# Patient Record
Sex: Male | Born: 1960
Health system: Southern US, Community
[De-identification: ages and names within clinical notes are randomized; demographics above are authoritative.]

## PROBLEM LIST (undated history)

## (undated) DIAGNOSIS — I10 Essential (primary) hypertension: Secondary | ICD-10-CM

## (undated) DIAGNOSIS — E785 Hyperlipidemia, unspecified: Secondary | ICD-10-CM

## (undated) HISTORY — PX: OTHER SURGICAL HISTORY: SHX169

## (undated) HISTORY — DX: Hyperlipidemia, unspecified: E78.5

## (undated) HISTORY — PX: HERNIA REPAIR: SHX51

---

## 2007-10-29 ENCOUNTER — Emergency Department (HOSPITAL_COMMUNITY): Admission: EM | Admit: 2007-10-29 | Discharge: 2007-10-29 | Payer: Self-pay | Admitting: Emergency Medicine

## 2008-01-07 ENCOUNTER — Ambulatory Visit: Payer: Self-pay | Admitting: Surgery

## 2008-01-14 ENCOUNTER — Ambulatory Visit: Payer: Self-pay | Admitting: Surgery

## 2009-11-26 IMAGING — CR DG FINGER THUMB 2+V*R*
1 series · 1 of 1 positions shown · non-contrast
Comparison: None

CLINICAL DATA: Laceration, shattered a glass

RIGHT THUMB 2+V

[view not recorded]
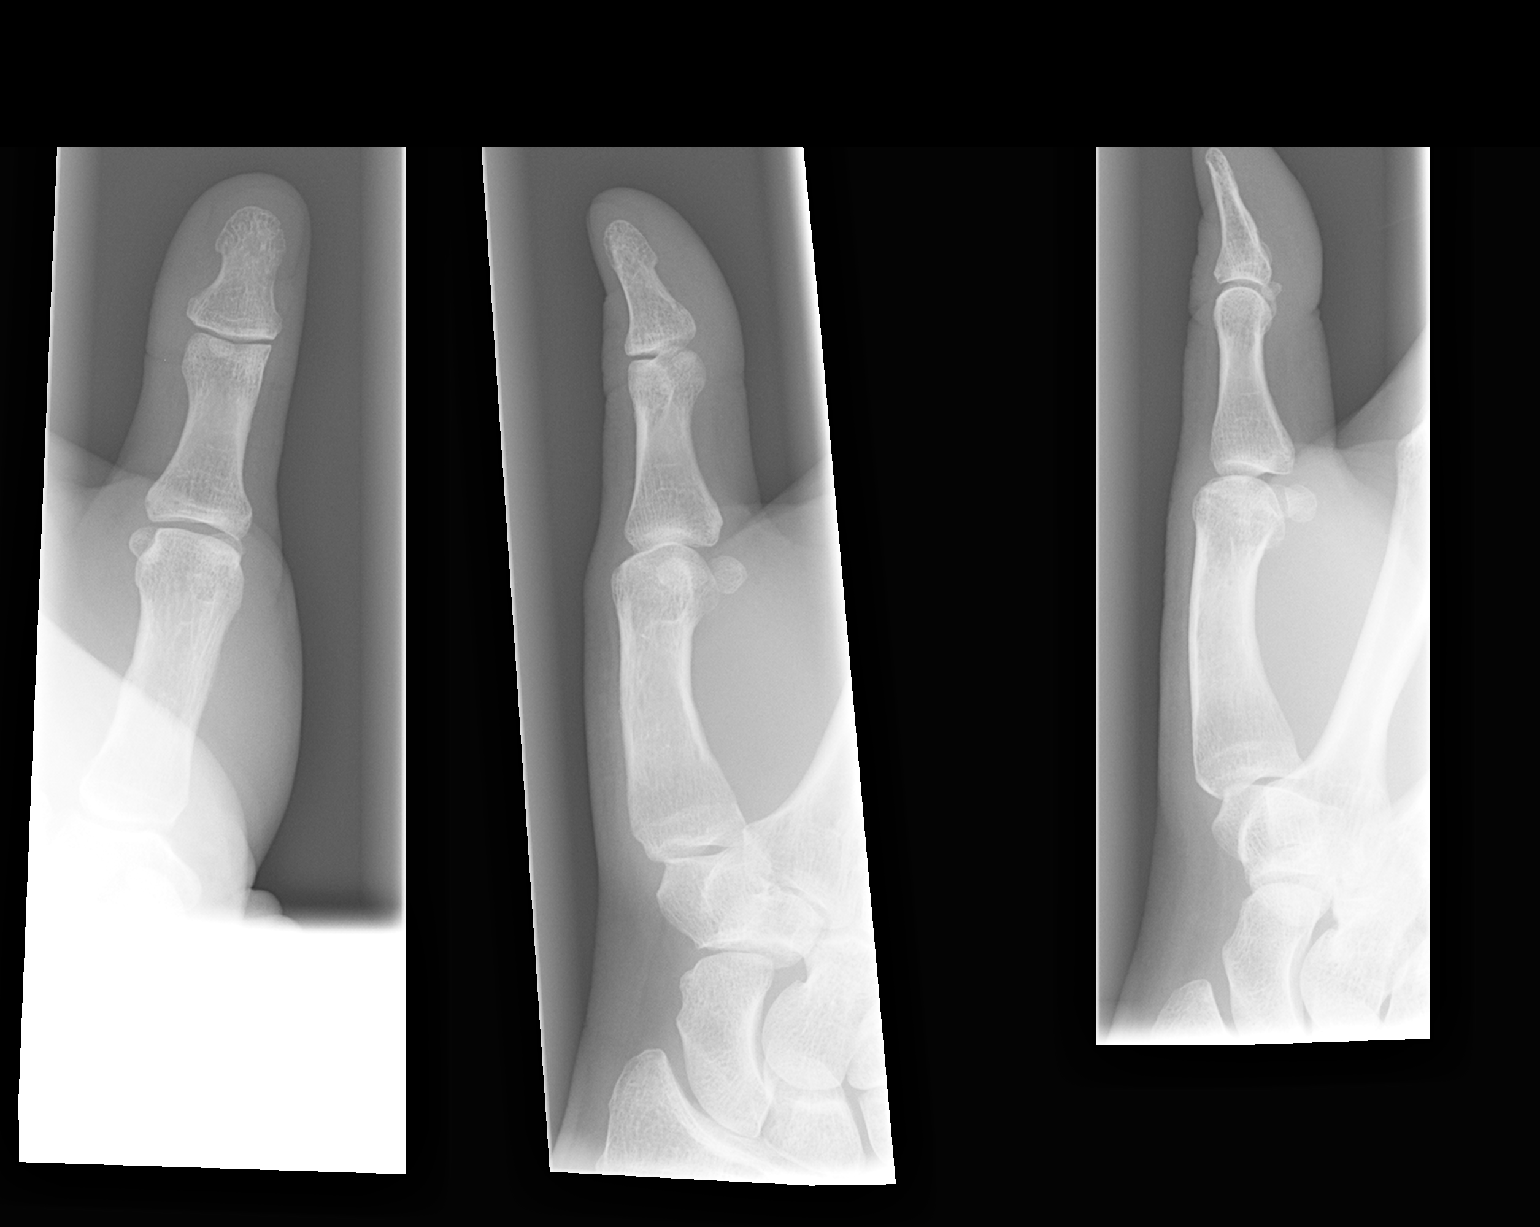

[1 of 1 positions shown; findings below may reference images not displayed]

FINDINGS: Bone mineralization normal.
Joint spaces preserved.
No fracture, dislocation, or bone destruction.
Tiny radiopacity seen in the ulnar soft tissues adjacent to head of
proximal phalanx left thumb, likely cassette artifact.
No definite radiopaque foreign body.
IMPRESSION: No acute abnormalities.

## 2013-01-12 ENCOUNTER — Emergency Department: Payer: Self-pay | Admitting: Emergency Medicine

## 2013-01-19 ENCOUNTER — Ambulatory Visit: Payer: Self-pay | Admitting: Family Medicine

## 2015-02-17 IMAGING — CR DG FOOT COMPLETE 3+V*L*
1 series · 3 of 3 positions shown · non-contrast
Comparison: None.

CLINICAL DATA: Foot pain

EXAM:
LEFT FOOT - COMPLETE 3+ VIEW

[Series 1: ap · 0.17mm/px · 3 of 3 slices shown]
[im 1/3]
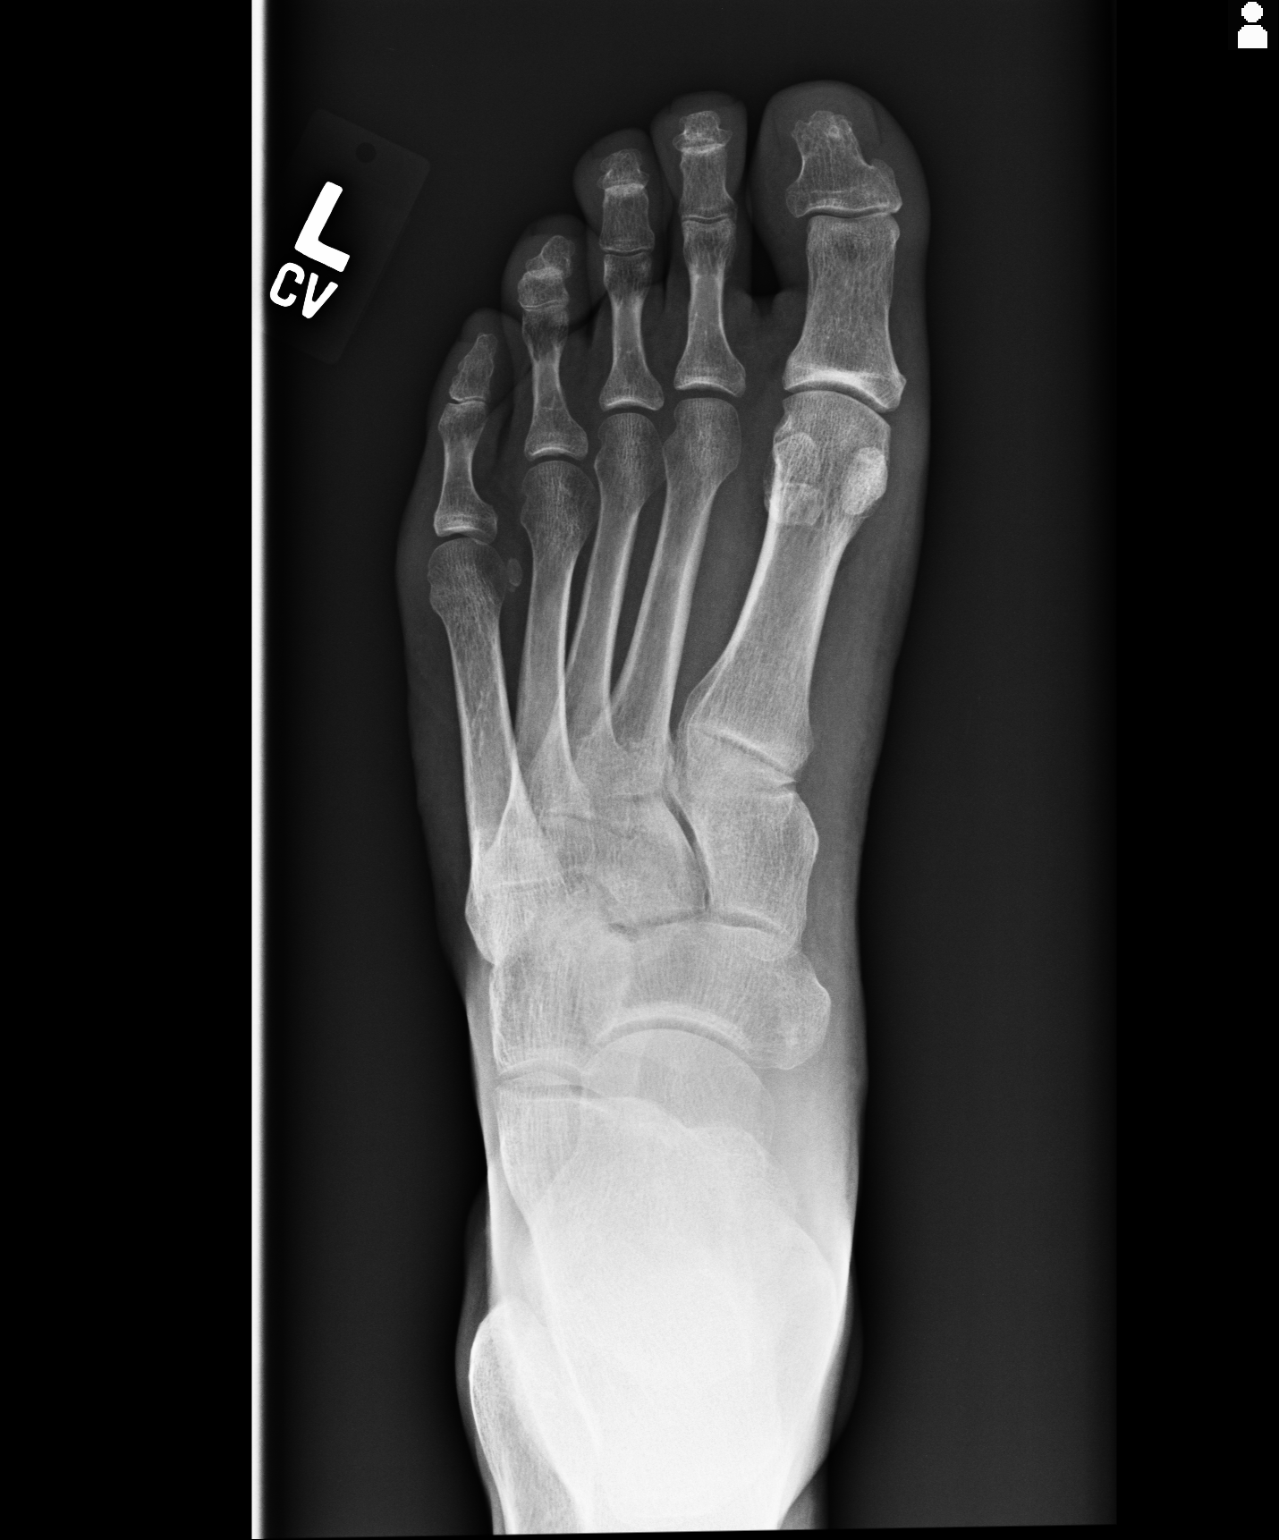
[im 2/3]
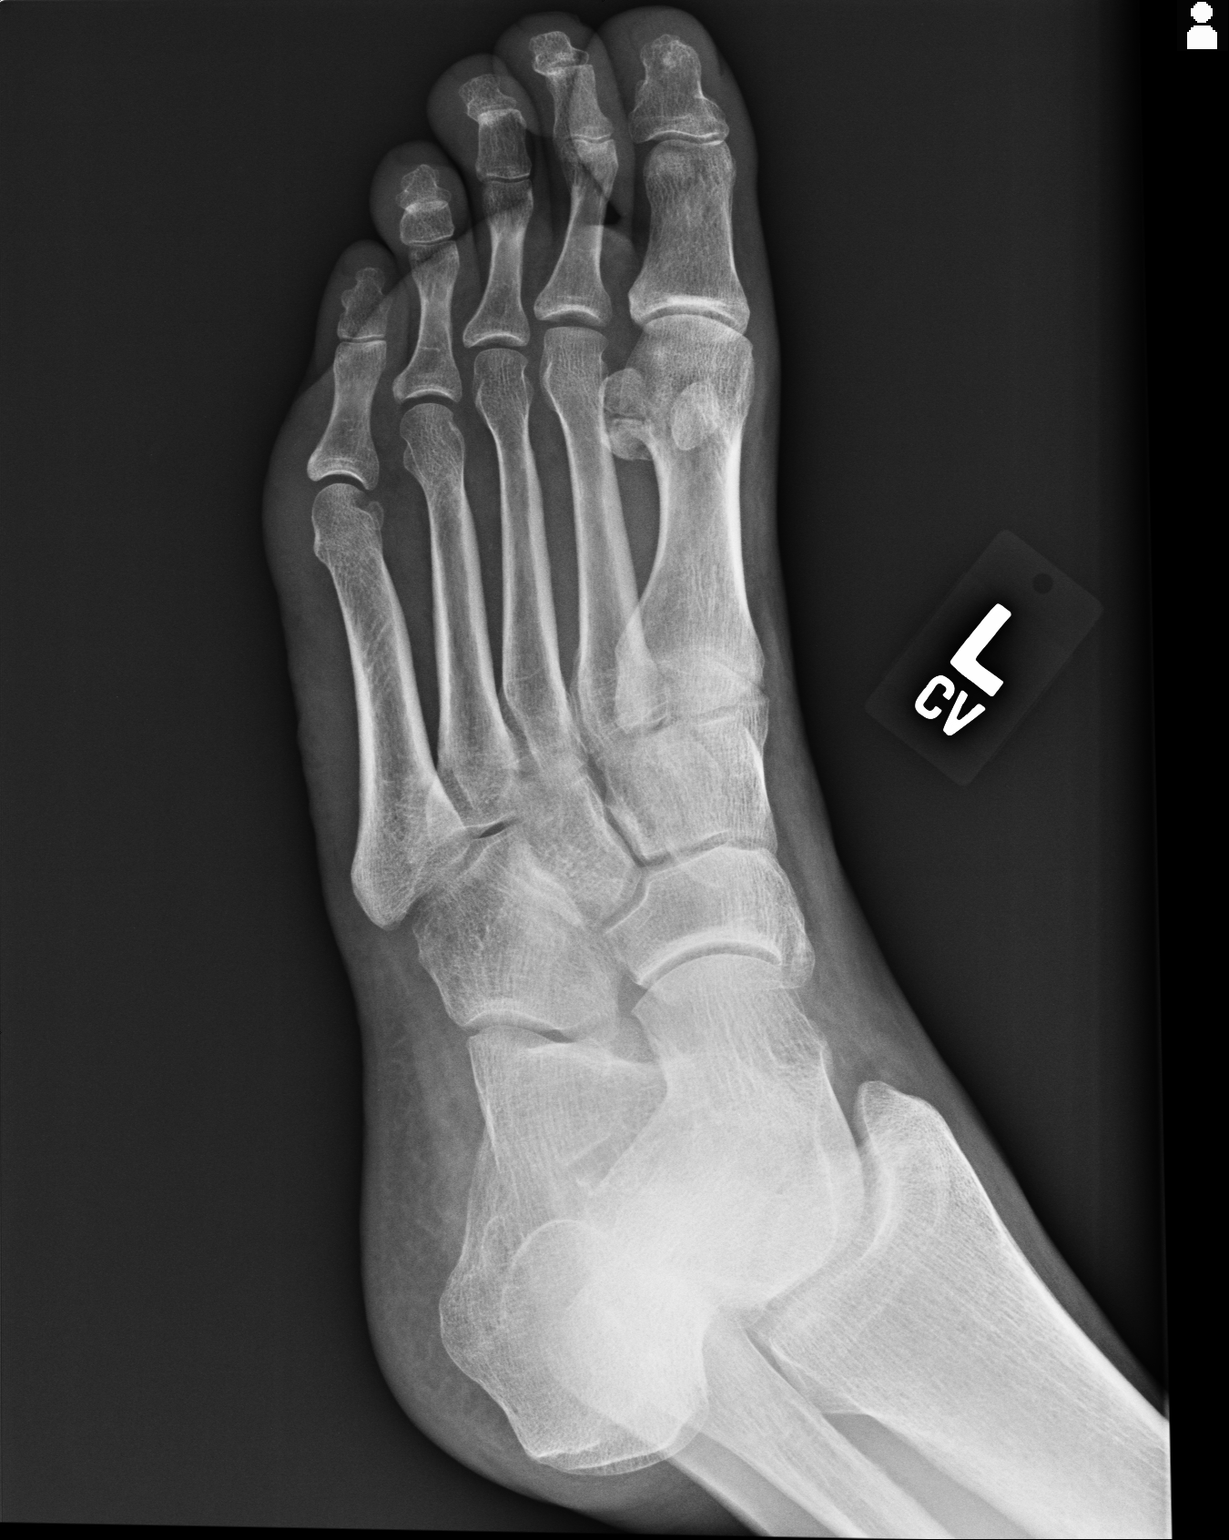
[im 3/3]
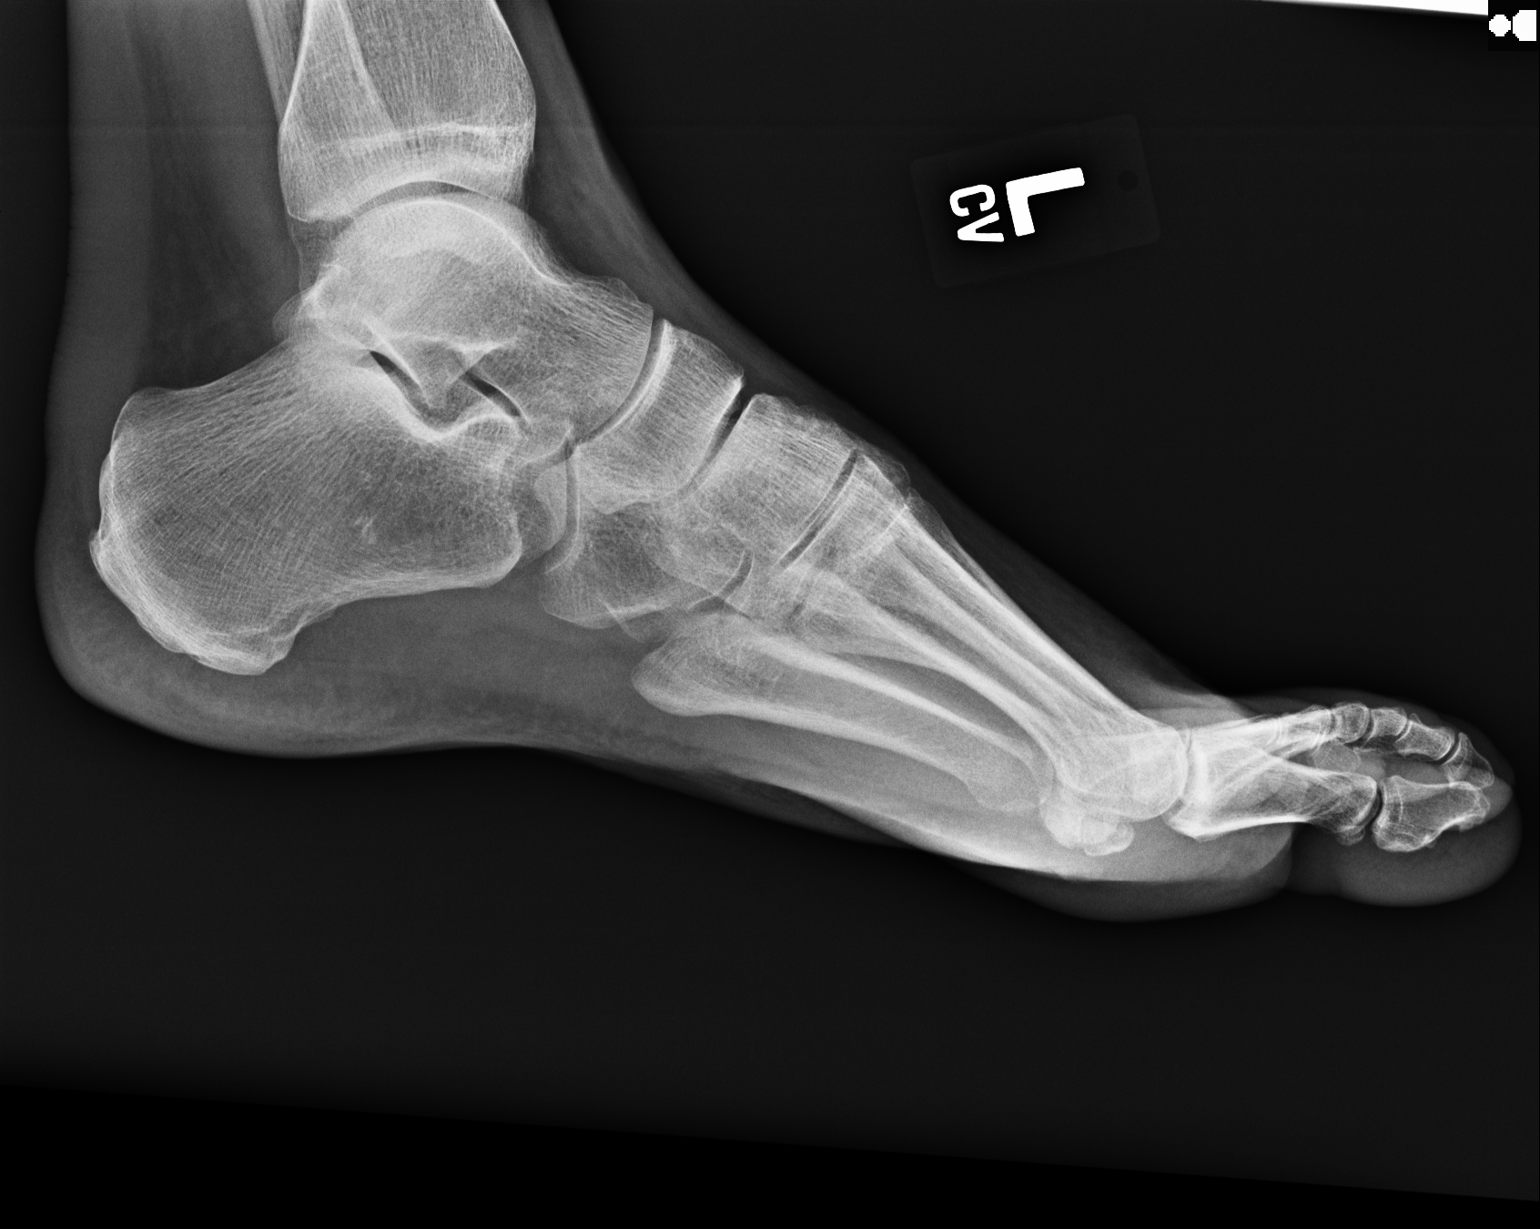

[3 of 3 positions shown; findings below may reference images not displayed]

FINDINGS: There is no evidence of fracture or dislocation. There is no
evidence of arthropathy or other focal bone abnormality. Soft
tissues are unremarkable.
IMPRESSION: Negative.

## 2015-08-16 DIAGNOSIS — H5213 Myopia, bilateral: Secondary | ICD-10-CM | POA: Diagnosis not present

## 2015-12-15 DIAGNOSIS — I10 Essential (primary) hypertension: Secondary | ICD-10-CM | POA: Diagnosis not present

## 2015-12-30 DIAGNOSIS — H6122 Impacted cerumen, left ear: Secondary | ICD-10-CM | POA: Diagnosis not present

## 2016-04-19 ENCOUNTER — Encounter: Payer: Self-pay | Admitting: Family Medicine

## 2016-04-19 ENCOUNTER — Ambulatory Visit (INDEPENDENT_AMBULATORY_CARE_PROVIDER_SITE_OTHER): Payer: 59 | Admitting: Family Medicine

## 2016-04-19 VITALS — BP 136/82 | Ht 73.5 in | Wt 203.8 lb

## 2016-04-19 DIAGNOSIS — I10 Essential (primary) hypertension: Secondary | ICD-10-CM

## 2016-04-19 MED ORDER — LISINOPRIL 10 MG PO TABS: 10.0000 mg | ORAL_TABLET | Freq: Every day | ORAL | 4 refills | Status: DC

## 2016-04-19 NOTE — Progress Notes (Signed)
   Subjective:    Patient ID: James Ochoa, male    DOB: [birth year], [age] y.o.   MRN: [id number]  Hypertension  This is a chronic problem. The current episode started more than 1 year ago. Risk factors for coronary artery disease include male gender. Treatments tried: zestoretic. There are no compliance problems.   This patient relates that overall he's been doing well. He denies any chest tightness pressure pain shortness of breath. Takes his blood pressure medicine uses it every other day because it makes him feel drowsy and low blood pressure. Denies sweats chills vomiting. Denies any strong family history of premature heart disease  Has been under a lot of stress because his son is been dealing with some substance abuse issues. Patient states he has been under a lot of stress and feels that is why his blood pressure is up   Review of Systems Patient denies any chest tightness pressure pain shortness of breath vomiting diarrhea etc.    Objective:   Physical Exam Neck no masses lungs are clear no crackles heart is regular pulse normal blood pressure is borderline elevated  Paced states his blood pressures at home are very good he states is more when he comes to a doctor's office       Assessment & Plan:  HTN we will reduce the medicine to lisinopril 10 mg daily stop the HCTZ portion patient will send us blood pressure readings in a couple weeks it is possible we may want to reduce the dosing to 5 mg if blood pressures are still low  He will fax to us these blood pressure readings plus also lipid profiles. He will follow-up in 6 months sooner if any problem

## 2016-04-19 NOTE — Patient Instructions (Signed)
Fax us your BP readings in a couple weeks  [phone]    DASH Eating Plan DASH stands for "Dietary Approaches to Stop Hypertension." The DASH eating plan is a healthy eating plan that has been shown to reduce high blood pressure (hypertension). Additional health benefits may include reducing the risk of type 2 diabetes mellitus, heart disease, and stroke. The DASH eating plan may also help with weight loss. What do I need to know about the DASH eating plan? For the DASH eating plan, you will follow these general guidelines:  Choose foods with less than 150 milligrams of sodium per serving (as listed on the food label).  Use salt-free seasonings or herbs instead of table salt or sea salt.  Check with your health care provider or pharmacist before using salt substitutes.  Eat lower-sodium products. These are often labeled as "low-sodium" or "no salt added."  Eat fresh foods. Avoid eating a lot of canned foods.  Eat more vegetables, fruits, and low-fat dairy products.  Choose whole grains. Look for the word "whole" as the first word in the ingredient list.  Choose fish and skinless chicken or [place] more often than red meat. Limit fish, poultry, and meat to 6 oz (170 g) each day.  Limit sweets, desserts, sugars, and sugary drinks.  Choose heart-healthy fats.  Eat more home-cooked food and less restaurant, buffet, and fast food.  Limit fried foods.  Do not fry foods. Cook foods using methods such as baking, boiling, grilling, and broiling instead.  When eating at a restaurant, ask that your food be prepared with less salt, or no salt if possible. What foods can I eat? Seek help from a dietitian for individual calorie needs. Grains  Whole grain or whole wheat bread. Brown rice. Whole grain or whole wheat pasta. Quinoa, bulgur, and whole grain cereals. Low-sodium cereals. Corn or whole wheat flour tortillas. Whole grain cornbread. Whole grain crackers. Low-sodium  crackers. Vegetables  Fresh or frozen vegetables (raw, steamed, roasted, or grilled). Low-sodium or reduced-sodium tomato and vegetable juices. Low-sodium or reduced-sodium tomato sauce and paste. Low-sodium or reduced-sodium canned vegetables. Fruits  All fresh, canned (in natural juice), or frozen fruits. Meat and Other Protein Products  Ground beef (85% or leaner), grass-fed beef, or beef trimmed of fat. Skinless chicken or [place]. Ground chicken or [place]. Pork trimmed of fat. All fish and seafood. Eggs. Dried beans, peas, or lentils. Unsalted nuts and seeds. Unsalted canned beans. Dairy  Low-fat dairy products, such as skim or 1% milk, 2% or reduced-fat cheeses, low-fat ricotta or cottage cheese, or plain low-fat yogurt. Low-sodium or reduced-sodium cheeses. Fats and Oils  Tub margarines without trans fats. Light or reduced-fat mayonnaise and salad dressings (reduced sodium). Avocado. Safflower, olive, or canola oils. Natural peanut or almond butter. Other  Unsalted popcorn and pretzels. The items listed above may not be a complete list of recommended foods or beverages. Contact your dietitian for more options.  What foods are not recommended? Grains  White bread. White pasta. White rice. Refined cornbread. Bagels and croissants. Crackers that contain trans fat. Vegetables  Creamed or fried vegetables. Vegetables in a cheese sauce. Regular canned vegetables. Regular canned tomato sauce and paste. Regular tomato and vegetable juices. Fruits  Canned fruit in light or heavy syrup. Fruit juice. Meat and Other Protein Products  Fatty cuts of meat. Ribs, chicken wings, bacon, sausage, bologna, salami, chitterlings, fatback, hot dogs, bratwurst, and packaged luncheon meats. Salted nuts and seeds. Canned beans with salt. Dairy  Whole or 2% milk, cream, half-and-half, and cream cheese. Whole-fat or sweetened yogurt. Full-fat cheeses or blue cheese. Nondairy creamers and whipped toppings.  Processed cheese, cheese spreads, or cheese curds. Condiments  Onion and garlic salt, seasoned salt, table salt, and sea salt. Canned and packaged gravies. Worcestershire sauce. Tartar sauce. Barbecue sauce. Teriyaki sauce. Soy sauce, including reduced sodium. Steak sauce. Fish sauce. Oyster sauce. Cocktail sauce. Horseradish. Ketchup and mustard. Meat flavorings and tenderizers. Bouillon cubes. Hot sauce. Tabasco sauce. Marinades. Taco seasonings. Relishes. Fats and Oils  Butter, stick margarine, lard, shortening, ghee, and bacon fat. Coconut, palm kernel, or palm oils. Regular salad dressings. Other  Pickles and olives. Salted popcorn and pretzels. The items listed above may not be a complete list of foods and beverages to avoid. Contact your dietitian for more information.  Where can I find more information? National Heart, Lung, and Blood Institute: travelstabloid.com This information is not intended to replace advice given to you by your health care provider. Make sure you discuss any questions you have with your health care provider. Document Released: 02/07/2011 Document Revised: 07/27/2015 Document Reviewed: 12/23/2012 Elsevier Interactive Patient Education  2017 [employer].

## 2016-10-17 ENCOUNTER — Encounter: Payer: Self-pay | Admitting: Family Medicine

## 2016-10-17 ENCOUNTER — Ambulatory Visit (INDEPENDENT_AMBULATORY_CARE_PROVIDER_SITE_OTHER): Payer: 59 | Admitting: Family Medicine

## 2016-10-17 VITALS — BP 128/78 | Ht 73.5 in | Wt 199.0 lb

## 2016-10-17 DIAGNOSIS — Z125 Encounter for screening for malignant neoplasm of prostate: Secondary | ICD-10-CM

## 2016-10-17 DIAGNOSIS — Z13220 Encounter for screening for lipoid disorders: Secondary | ICD-10-CM

## 2016-10-17 DIAGNOSIS — Z79899 Other long term (current) drug therapy: Secondary | ICD-10-CM

## 2016-10-17 DIAGNOSIS — I10 Essential (primary) hypertension: Secondary | ICD-10-CM

## 2016-10-17 MED ORDER — LISINOPRIL 10 MG PO TABS: 10.0000 mg | ORAL_TABLET | Freq: Every day | ORAL | 6 refills | Status: DC

## 2016-10-17 NOTE — Patient Instructions (Signed)
DASH Eating Plan DASH stands for "Dietary Approaches to Stop Hypertension." The DASH eating plan is a healthy eating plan that has been shown to reduce high blood pressure (hypertension). It may also reduce your risk for type 2 diabetes, heart disease, and stroke. The DASH eating plan may also help with weight loss. What are tips for following this plan? General guidelines  Avoid eating more than 2,300 mg (milligrams) of salt (sodium) a day. If you have hypertension, you may need to reduce your sodium intake to 1,500 mg a day.  Limit alcohol intake to no more than 1 drink a day for nonpregnant women and 2 drinks a day for men. One drink equals 12 oz of beer, 5 oz of wine, or 1 oz of hard liquor.  Work with your health care provider to maintain a healthy body weight or to lose weight. Ask what an ideal weight is for you.  Get at least 30 minutes of exercise that causes your heart to beat faster (aerobic exercise) most days of the week. Activities may include walking, swimming, or biking.  Work with your health care provider or diet and nutrition specialist (dietitian) to adjust your eating plan to your individual calorie needs. Reading food labels  Check food labels for the amount of sodium per serving. Choose foods with less than 5 percent of the Daily Value of sodium. Generally, foods with less than 300 mg of sodium per serving fit into this eating plan.  To find whole grains, look for the word "whole" as the first word in the ingredient list. Shopping  Buy products labeled as "low-sodium" or "no salt added."  Buy fresh foods. Avoid canned foods and premade or frozen meals. Cooking  Avoid adding salt when cooking. Use salt-free seasonings or herbs instead of table salt or sea salt. Check with your health care provider or pharmacist before using salt substitutes.  Do not fry foods. Cook foods using healthy methods such as baking, boiling, grilling, and broiling instead.  Cook with  heart-healthy oils, such as olive, canola, soybean, or sunflower oil. Meal planning   Eat a balanced diet that includes: ? 5 or more servings of fruits and vegetables each day. At each meal, try to fill half of your plate with fruits and vegetables. ? Up to 6-8 servings of whole grains each day. ? Less than 6 oz of lean meat, poultry, or fish each day. A 3-oz serving of meat is about the same size as a deck of cards. One egg equals 1 oz. ? 2 servings of low-fat dairy each day. ? A serving of nuts, seeds, or beans 5 times each week. ? Heart-healthy fats. Healthy fats called Omega-3 fatty acids are found in foods such as flaxseeds and coldwater fish, like sardines, salmon, and mackerel.  Limit how much you eat of the following: ? Canned or prepackaged foods. ? Food that is high in trans fat, such as fried foods. ? Food that is high in saturated fat, such as fatty meat. ? Sweets, desserts, sugary drinks, and other foods with added sugar. ? Full-fat dairy products.  Do not salt foods before eating.  Try to eat at least 2 vegetarian meals each week.  Eat more home-cooked food and less restaurant, buffet, and fast food.  When eating at a restaurant, ask that your food be prepared with less salt or no salt, if possible. What foods are recommended? The items listed may not be a complete list. Talk with your dietitian about what   dietary choices are best for you. Grains Whole-grain or whole-wheat bread. Whole-grain or whole-wheat pasta. Brown rice. Oatmeal. Quinoa. Bulgur. Whole-grain and low-sodium cereals. Pita bread. Low-fat, low-sodium crackers. Whole-wheat flour tortillas. Vegetables Fresh or frozen vegetables (raw, steamed, roasted, or grilled). Low-sodium or reduced-sodium tomato and vegetable juice. Low-sodium or reduced-sodium tomato sauce and tomato paste. Low-sodium or reduced-sodium canned vegetables. Fruits All fresh, dried, or frozen fruit. Canned fruit in natural juice (without  added sugar). Meat and other protein foods Skinless chicken or turkey. Ground chicken or turkey. Pork with fat trimmed off. Fish and seafood. Egg whites. Dried beans, peas, or lentils. Unsalted nuts, nut butters, and seeds. Unsalted canned beans. Lean cuts of beef with fat trimmed off. Low-sodium, lean deli meat. Dairy Low-fat (1%) or fat-free (skim) milk. Fat-free, low-fat, or reduced-fat cheeses. Nonfat, low-sodium ricotta or cottage cheese. Low-fat or nonfat yogurt. Low-fat, low-sodium cheese. Fats and oils Soft margarine without trans fats. Vegetable oil. Low-fat, reduced-fat, or light mayonnaise and salad dressings (reduced-sodium). Canola, safflower, olive, soybean, and sunflower oils. Avocado. Seasoning and other foods Herbs. Spices. Seasoning mixes without salt. Unsalted popcorn and pretzels. Fat-free sweets. What foods are not recommended? The items listed may not be a complete list. Talk with your dietitian about what dietary choices are best for you. Grains Baked goods made with fat, such as croissants, muffins, or some breads. Dry pasta or rice meal packs. Vegetables Creamed or fried vegetables. Vegetables in a cheese sauce. Regular canned vegetables (not low-sodium or reduced-sodium). Regular canned tomato sauce and paste (not low-sodium or reduced-sodium). Regular tomato and vegetable juice (not low-sodium or reduced-sodium). Pickles. Olives. Fruits Canned fruit in a light or heavy syrup. Fried fruit. Fruit in cream or butter sauce. Meat and other protein foods Fatty cuts of meat. Ribs. Fried meat. Bacon. Sausage. Bologna and other processed lunch meats. Salami. Fatback. Hotdogs. Bratwurst. Salted nuts and seeds. Canned beans with added salt. Canned or smoked fish. Whole eggs or egg yolks. Chicken or turkey with skin. Dairy Whole or 2% milk, cream, and half-and-half. Whole or full-fat cream cheese. Whole-fat or sweetened yogurt. Full-fat cheese. Nondairy creamers. Whipped toppings.  Processed cheese and cheese spreads. Fats and oils Butter. Stick margarine. Lard. Shortening. Ghee. Bacon fat. Tropical oils, such as coconut, palm kernel, or palm oil. Seasoning and other foods Salted popcorn and pretzels. Onion salt, garlic salt, seasoned salt, table salt, and sea salt. Worcestershire sauce. Tartar sauce. Barbecue sauce. Teriyaki sauce. Soy sauce, including reduced-sodium. Steak sauce. Canned and packaged gravies. Fish sauce. Oyster sauce. Cocktail sauce. Horseradish that you find on the shelf. Ketchup. Mustard. Meat flavorings and tenderizers. Bouillon cubes. Hot sauce and Tabasco sauce. Premade or packaged marinades. Premade or packaged taco seasonings. Relishes. Regular salad dressings. Where to find more information:  National Heart, Lung, and Blood Institute: www.nhlbi.nih.gov  American Heart Association: www.heart.org Summary  The DASH eating plan is a healthy eating plan that has been shown to reduce high blood pressure (hypertension). It may also reduce your risk for type 2 diabetes, heart disease, and stroke.  With the DASH eating plan, you should limit salt (sodium) intake to 2,300 mg a day. If you have hypertension, you may need to reduce your sodium intake to 1,500 mg a day.  When on the DASH eating plan, aim to eat more fresh fruits and vegetables, whole grains, lean proteins, low-fat dairy, and heart-healthy fats.  Work with your health care provider or diet and nutrition specialist (dietitian) to adjust your eating plan to your individual   calorie needs. This information is not intended to replace advice given to you by your health care provider. Make sure you discuss any questions you have with your health care provider. Document Released: 02/07/2011 Document Revised: 02/12/2016 Document Reviewed: 02/12/2016 Elsevier Interactive Patient Education  2017 Else[employer] Inc.  

## 2016-10-17 NOTE — Progress Notes (Signed)
   Subjective:    Patient ID: James Ochoa, male    DOB: [birth year], 58 y.o.   MRN: [id number]  HPIHTN. Eats healthy, exercises, takes lisinopril 10mg  every day. He does exercise he watches how he eats he does take his medicine regular basis denies any chest tightness pressure pain shortness breath states blood pressure readings have been good  Sense of smell has been off since spraying insect spray over one year ago.  Patient denies any headaches fever chills nosebleeds does relate some sinus issues   Review of Systems  Constitutional: Negative for activity change, fatigue and fever.  HENT: Negative for congestion, ear pain, rhinorrhea and tinnitus.   Eyes: Negative for discharge.  Respiratory: Negative for cough, shortness of breath and wheezing.   Cardiovascular: Negative for chest pain and leg swelling.  Neurological: Negative for headaches.       Objective:   Physical Exam  Constitutional: He appears well-developed and well-nourished. No distress.  HENT:  Head: Normocephalic.  Mouth/Throat: Oropharynx is clear and moist. No oropharyngeal exudate.  Neck: Normal range of motion.  Cardiovascular: Normal rate, regular rhythm and normal heart sounds.   No murmur heard. Pulmonary/Chest: Effort normal and breath sounds normal. No respiratory distress. He has no wheezes.  Musculoskeletal: He exhibits no edema.  Lymphadenopathy:    He has no cervical adenopathy.  Neurological: He is alert. He exhibits normal muscle tone.  Skin: Skin is warm and dry.  Psychiatric: His behavior is normal.  Nursing note and vitals reviewed.   I did advise patient get a colonoscopy he will think about it let us now      Assessment & Plan:  HTN-good control HTN- Patient was seen today as part of a visit regarding hypertension. The importance of healthy diet and regular physical activity was discussed. The importance of compliance with medications discussed. Ideal goal is to keep blood pressure low  elevated levels certainly below 140/90 when possible. The patient was counseled that keeping blood pressure under control lessen his risk of heart attack, stroke, kidney failure, and early death. The importance of regular follow-ups was discussed with the patient. Low-salt diet such as DASH recommended. Regular physical activity was recommended as well. Patient was advised to keep regular follow-ups.  Lab work ordered  Patient to consider colonoscopy  No obvious reason for lack of smell I doubt brain tumor if the patient starts having headaches harder time using his sinuses her breathing he will notify us and we will set him up with ENT

## 2017-04-10 ENCOUNTER — Other Ambulatory Visit (HOSPITAL_COMMUNITY)
Admission: AD | Admit: 2017-04-10 | Discharge: 2017-04-10 | Disposition: A | Discharge status: Home / Self Care | Payer: 59 | Source: Ambulatory Visit | Attending: Family Medicine | Admitting: Family Medicine

## 2017-04-10 DIAGNOSIS — E7849 Other hyperlipidemia: Secondary | ICD-10-CM | POA: Insufficient documentation

## 2017-04-10 DIAGNOSIS — Z79899 Other long term (current) drug therapy: Secondary | ICD-10-CM | POA: Diagnosis not present

## 2017-04-10 LAB — LIPID PANEL
Cholesterol: 245 mg/dL — ABNORMAL HIGH (ref 0–200)
HDL: 47 mg/dL (ref >40)
LDL Cholesterol: 190 mg/dL — ABNORMAL HIGH (ref 0–99)
TRIGLYCERIDES: 41 mg/dL (ref <150)
Total CHOL/HDL Ratio: 5.2 RATIO
VLDL: 8 mg/dL (ref 0–40)

## 2017-04-10 LAB — HEPATIC FUNCTION PANEL
ALT: 32 U/L (ref 17–63)
AST: 30 U/L (ref 15–41)
Albumin: 4.9 g/dL (ref 3.5–5.0)
Alkaline Phosphatase: 44 U/L (ref 38–126)
BILIRUBIN DIRECT: 0.2 mg/dL (ref 0.1–0.5)
BILIRUBIN TOTAL: 1.2 mg/dL (ref 0.3–1.2)
Indirect Bilirubin: 1 mg/dL — ABNORMAL HIGH (ref 0.3–0.9)
Total Protein: 8.5 g/dL — ABNORMAL HIGH (ref 6.5–8.1)

## 2017-04-10 LAB — BASIC METABOLIC PANEL
Anion gap: 12 (ref 5–15)
BUN: 13 mg/dL (ref 6–20)
CALCIUM: 9.5 mg/dL (ref 8.9–10.3)
CO2: 25 mmol/L (ref 22–32)
Chloride: 99 mmol/L — ABNORMAL LOW (ref 101–111)
Creatinine, Ser: 0.92 mg/dL (ref 0.61–1.24)
GFR calc Af Amer: >60 mL/min (ref >60)
GLUCOSE: 91 mg/dL (ref 65–99)
Potassium: 4.2 mmol/L (ref 3.5–5.1)
Sodium: 136 mmol/L (ref 135–145)

## 2017-04-11 LAB — PSA: Prostatic Specific Antigen: 0.42 ng/mL (ref 0.00–4.00)

## 2017-04-15 ENCOUNTER — Ambulatory Visit: Payer: 59 | Admitting: Family Medicine

## 2017-04-15 ENCOUNTER — Encounter: Payer: Self-pay | Admitting: Family Medicine

## 2017-04-15 VITALS — BP 140/98 | Ht 73.5 in | Wt 203.0 lb

## 2017-04-15 DIAGNOSIS — I10 Essential (primary) hypertension: Secondary | ICD-10-CM | POA: Diagnosis not present

## 2017-04-15 DIAGNOSIS — Z79899 Other long term (current) drug therapy: Secondary | ICD-10-CM | POA: Diagnosis not present

## 2017-04-15 DIAGNOSIS — E7849 Other hyperlipidemia: Secondary | ICD-10-CM | POA: Diagnosis not present

## 2017-04-15 MED ORDER — PRAVASTATIN SODIUM 20 MG PO TABS: 20.0000 mg | ORAL_TABLET | Freq: Every day | ORAL | 5 refills | Status: DC

## 2017-04-15 NOTE — Progress Notes (Signed)
Subjective:    Patient ID: James Ochoa, male    DOB: 03/25/1960, 57 y.o.   MRN: [id number]  HPI  Patient is here today to follow up on Htn.Takes Lisinopril 10 mg one Qd.He eats healthy and exercises.He does not see any specialists.  Patient for blood pressure check up. Patient relates compliance with meds. Todays BP reviewed with the patient. Patient denies issues with medication. Patient relates reasonable diet. Patient tries to minimize salt. Patient aware of BP goals.  Patient here for follow-up regarding cholesterol.  Patient does try to maintain a reasonable diet.  Patient does take the medication on a regular basis.  Denies missing a dose.  The patient denies any obvious side effects.  Prior blood work results reviewed with the patient.  The patient is aware of his cholesterol goals and the need to keep it under good control to lessen the risk of disease.   Review of Systems  Constitutional: Negative for activity change, appetite change and fatigue.  HENT: Negative for congestion and rhinorrhea.   Respiratory: Negative for cough, chest tightness and shortness of breath.   Cardiovascular: Negative for chest pain and leg swelling.  Gastrointestinal: Negative for abdominal pain, diarrhea and nausea.  Endocrine: Negative for polydipsia and polyphagia.  Genitourinary: Negative for dysuria and hematuria.  Neurological: Negative for weakness and headaches.  Psychiatric/Behavioral: Negative for confusion and dysphoric mood.       Objective:   Physical Exam  Constitutional: He appears well-nourished. No distress.  HENT:  Head: Normocephalic and atraumatic.  Eyes: Right eye exhibits no discharge. Left eye exhibits no discharge.  Neck: No tracheal deviation present.  Cardiovascular: Normal rate, regular rhythm and normal heart sounds.  No murmur heard. Pulmonary/Chest: Effort normal and breath sounds normal. No respiratory distress. He has no wheezes.  Musculoskeletal: He exhibits no  edema.  Lymphadenopathy:    He has no cervical adenopathy.  Neurological: He is alert.  Skin: Skin is warm. No rash noted.  Psychiatric: His behavior is normal.  Vitals reviewed.          Assessment & Plan:  HTN- Patient was seen today as part of a visit regarding hypertension. The importance of healthy diet and regular physical activity was discussed. The importance of compliance with medications discussed. Ideal goal is to keep blood pressure low elevated levels certainly below 140/90 when possible. The patient was counseled that keeping blood pressure under control lessen his risk of heart attack, stroke, kidney failure, and early death. The importance of regular follow-ups was discussed with the patient. Low-salt diet such as DASH recommended. Regular physical activity was recommended as well. Patient was advised to keep regular follow-ups.  The patient was seen today as part of an evaluation regarding hyperlipidemia. Recent lab work has been reviewed with the patient as well as the goals for good cholesterol care. In addition to this medications have been discussed the importance of compliance with diet and medications discussed as well. Patient has been informed of potential side effects of medications in the importance to notify us should any problems occur. Finally the patient is aware that poor control of cholesterol, noncompliance can dramatically increase her risk of heart attack strokes and premature death. The patient will keep regular office visits and the patient does agreed to periodic lab work. Patient agrees to start statins side effects discussed with the patient patient will do lipid liver in approximately 8 weeks in addition to this patient will do our standard follow-up visit in  6 months

## 2017-04-20 ENCOUNTER — Encounter: Payer: Self-pay | Admitting: Family Medicine

## 2017-04-22 ENCOUNTER — Other Ambulatory Visit: Payer: Self-pay | Admitting: *Deleted

## 2017-04-22 MED ORDER — LISINOPRIL 10 MG PO TABS: 10.0000 mg | ORAL_TABLET | Freq: Every day | ORAL | 1 refills | Status: DC

## 2017-10-09 ENCOUNTER — Other Ambulatory Visit (HOSPITAL_COMMUNITY)
Admission: RE | Admit: 2017-10-09 | Discharge: 2017-10-09 | Disposition: A | Discharge status: Home / Self Care | Payer: 59 | Source: Ambulatory Visit | Attending: Family Medicine | Admitting: Family Medicine

## 2017-10-09 DIAGNOSIS — Z79899 Other long term (current) drug therapy: Secondary | ICD-10-CM | POA: Diagnosis not present

## 2017-10-09 DIAGNOSIS — E7849 Other hyperlipidemia: Secondary | ICD-10-CM | POA: Diagnosis not present

## 2017-10-09 LAB — HEPATIC FUNCTION PANEL
ALK PHOS: 50 U/L (ref 38–126)
ALT: 38 U/L (ref 0–44)
AST: 33 U/L (ref 15–41)
Albumin: 4.8 g/dL (ref 3.5–5.0)
BILIRUBIN INDIRECT: 1 mg/dL — AB (ref 0.3–0.9)
Bilirubin, Direct: 0.2 mg/dL (ref 0.0–0.2)
Total Bilirubin: 1.2 mg/dL (ref 0.3–1.2)
Total Protein: 8.1 g/dL (ref 6.5–8.1)

## 2017-10-09 LAB — LIPID PANEL
CHOL/HDL RATIO: 5.8 ratio
Cholesterol: 225 mg/dL — ABNORMAL HIGH (ref 0–200)
HDL: 39 mg/dL — AB (ref >40)
LDL CALC: 168 mg/dL — AB (ref 0–99)
Triglycerides: 91 mg/dL (ref <150)
VLDL: 18 mg/dL (ref 0–40)

## 2017-10-13 ENCOUNTER — Ambulatory Visit: Payer: 59 | Admitting: Family Medicine

## 2017-10-13 ENCOUNTER — Encounter: Payer: Self-pay | Admitting: Family Medicine

## 2017-10-13 VITALS — BP 136/82 | Ht 73.5 in | Wt 206.2 lb

## 2017-10-13 DIAGNOSIS — E7849 Other hyperlipidemia: Secondary | ICD-10-CM | POA: Diagnosis not present

## 2017-10-13 DIAGNOSIS — I10 Essential (primary) hypertension: Secondary | ICD-10-CM | POA: Diagnosis not present

## 2017-10-13 MED ORDER — PRAVASTATIN SODIUM 20 MG PO TABS: 20.0000 mg | ORAL_TABLET | Freq: Every day | ORAL | 5 refills | Status: DC

## 2017-10-13 MED ORDER — LISINOPRIL 10 MG PO TABS: 10.0000 mg | ORAL_TABLET | Freq: Every day | ORAL | 1 refills | Status: DC

## 2017-10-13 MED ORDER — ZOSTER VAC RECOMB ADJUVANTED 50 MCG/0.5ML IM SUSR: 0.5000 mL | Freq: Once | INTRAMUSCULAR | 1 refills | Status: AC

## 2017-10-13 NOTE — Patient Instructions (Signed)

## 2017-10-13 NOTE — Progress Notes (Signed)
   Subjective:    Patient ID: James Ochoa, male    DOB: 10/13/1960, 57 y.o.   MRN: [id number]  Hypertension  This is a chronic problem. The current episode started more than 1 year ago. The problem has been gradually improving since onset. Pertinent negatives include no chest pain, headaches, malaise/fatigue, neck pain, shortness of breath or sweats. The current treatment provides significant improvement. There are no compliance problems.     Patient here for follow-up regarding cholesterol.  The patient does have hyperlipidemia.  Patient does try to maintain a reasonable diet.  Patient does take the medication on a regular basis.  Recently he has been taking the medicine every other day rather than every single day   denies missing a dose.  The patient denies any obvious side effects.  Prior blood work results reviewed with the patient.  The patient is aware of his cholesterol goals and the need to keep it under good control to lessen the risk of disease.    Review of Systems  Constitutional: Negative for diaphoresis, fatigue and malaise/fatigue.  HENT: Negative for congestion and rhinorrhea.   Respiratory: Negative for cough and shortness of breath.   Cardiovascular: Negative for chest pain and leg swelling.  Gastrointestinal: Negative for abdominal pain and diarrhea.  Musculoskeletal: Negative for neck pain.  Skin: Negative for color change and rash.  Neurological: Negative for dizziness and headaches.  Psychiatric/Behavioral: Negative for behavioral problems and confusion.       Objective:   Physical Exam  Constitutional: He appears well-nourished. No distress.  HENT:  Head: Normocephalic and atraumatic.  Eyes: Right eye exhibits no discharge. Left eye exhibits no discharge.  Neck: No tracheal deviation present.  Cardiovascular: Normal rate, regular rhythm and normal heart sounds.  No murmur heard. Pulmonary/Chest: Effort normal and breath sounds normal. No respiratory distress.   Musculoskeletal: He exhibits no edema.  Lymphadenopathy:    He has no cervical adenopathy.  Neurological: He is alert. Coordination normal.  Skin: Skin is warm and dry.  Psychiatric: He has a normal mood and affect. His behavior is normal.  Vitals reviewed.    Shingrix discussed     Assessment & Plan:  Hyperlipidemia subpar control making some improvements need to take the medicine every single day check lipid in 8 weeks more than likely will need to make adjustments in medication the goal is to get LDL below 100  Blood pressure good control continue current measures continue to do running as well as calisthenics and eating healthy  Wellness exam in 6 months

## 2018-04-10 ENCOUNTER — Other Ambulatory Visit: Payer: Self-pay | Admitting: Family Medicine

## 2018-04-14 ENCOUNTER — Other Ambulatory Visit (HOSPITAL_COMMUNITY)
Admission: AD | Admit: 2018-04-14 | Discharge: 2018-04-14 | Disposition: A | Discharge status: Home / Self Care | Payer: 59 | Source: Other Acute Inpatient Hospital | Attending: Family Medicine | Admitting: Family Medicine

## 2018-04-14 DIAGNOSIS — E782 Mixed hyperlipidemia: Secondary | ICD-10-CM | POA: Insufficient documentation

## 2018-04-14 DIAGNOSIS — E7849 Other hyperlipidemia: Secondary | ICD-10-CM | POA: Diagnosis present

## 2018-04-14 LAB — LIPID PANEL
Cholesterol: 193 mg/dL (ref 0–200)
HDL: 41 mg/dL (ref >40)
LDL CALC: 135 mg/dL — AB (ref 0–99)
Total CHOL/HDL Ratio: 4.7 RATIO
Triglycerides: 84 mg/dL (ref <150)
VLDL: 17 mg/dL (ref 0–40)

## 2018-04-15 ENCOUNTER — Encounter: Payer: Self-pay | Admitting: Family Medicine

## 2018-04-15 ENCOUNTER — Ambulatory Visit (INDEPENDENT_AMBULATORY_CARE_PROVIDER_SITE_OTHER): Payer: 59 | Admitting: Family Medicine

## 2018-04-15 VITALS — BP 132/86 | Ht 73.5 in | Wt 208.8 lb

## 2018-04-15 DIAGNOSIS — I10 Essential (primary) hypertension: Secondary | ICD-10-CM

## 2018-04-15 DIAGNOSIS — G4700 Insomnia, unspecified: Secondary | ICD-10-CM | POA: Diagnosis not present

## 2018-04-15 DIAGNOSIS — Z125 Encounter for screening for malignant neoplasm of prostate: Secondary | ICD-10-CM

## 2018-04-15 DIAGNOSIS — Z0000 Encounter for general adult medical examination without abnormal findings: Secondary | ICD-10-CM

## 2018-04-15 DIAGNOSIS — E782 Mixed hyperlipidemia: Secondary | ICD-10-CM

## 2018-04-15 DIAGNOSIS — E785 Hyperlipidemia, unspecified: Secondary | ICD-10-CM

## 2018-04-15 HISTORY — DX: Hyperlipidemia, unspecified: E78.5

## 2018-04-15 NOTE — Progress Notes (Signed)
Subjective:    Patient ID: James Ochoa, male    DOB: 03/05/1960, 57 y.o.   MRN: [id number]  HPI The patient comes in today for a wellness visit. Talk with the patient at length about the stress he is under His son who is of adult age unfortunately uses a lot of drugs in stays at home does not work consistently he has been very hard stressful issue Also the patient is concerned about his son's salvation in regards to Christianity   A review of their health history was completed.  A review of medications was also completed.  Any needed refills; yes Patient for blood pressure check up.  The patient does have hypertension.  The patient is on medication.  Patient relates compliance with meds. Todays BP reviewed with the patient. Patient denies issues with medication. Patient relates reasonable diet. Patient tries to minimize salt. Patient aware of BP goals.  Patient here for follow-up regarding cholesterol.  The patient does have hyperlipidemia.  Patient does try to maintain a reasonable diet.  Patient does take the medication on a regular basis.  Denies missing a dose.  The patient denies any obvious side effects.  Prior blood work results reviewed with the patient.  The patient is aware of his cholesterol goals and the need to keep it under good control to lessen the risk of disease.  Insomnia uses relaxation techniques to help patient does not want to do any type of counseling Eating habits: yes eating healthy  Falls/  MVA accidents in past few months: none  Regular exercise: yes pretty regularly  Specialist pt sees on regular basis: no  Preventative health issues were discussed.   Additional concerns: patient reports problems with stress and trouble sleeping    Review of Systems  Constitutional: Negative for activity change, appetite change and fever.  HENT: Negative for congestion and rhinorrhea.   Eyes: Negative for discharge.  Respiratory: Negative for cough and wheezing.     Cardiovascular: Negative for chest pain.  Gastrointestinal: Negative for abdominal pain, blood in stool and vomiting.  Genitourinary: Negative for difficulty urinating and frequency.  Musculoskeletal: Negative for neck pain.  Skin: Negative for rash.  Allergic/Immunologic: Negative for environmental allergies and food allergies.  Neurological: Negative for weakness and headaches.  Psychiatric/Behavioral: Negative for agitation.       Objective:   Physical Exam Constitutional:      Appearance: He is well-developed.  HENT:     Head: Normocephalic and atraumatic.     Right Ear: External ear normal.     Left Ear: External ear normal.     Nose: Nose normal.  Eyes:     Pupils: Pupils are equal, round, and reactive to light.  Neck:     Musculoskeletal: Normal range of motion and neck supple.     Thyroid: No thyromegaly.  Cardiovascular:     Rate and Rhythm: Normal rate and regular rhythm.     Heart sounds: Normal heart sounds. No murmur.  Pulmonary:     Effort: Pulmonary effort is normal. No respiratory distress.     Breath sounds: Normal breath sounds. No wheezing.  Abdominal:     General: Bowel sounds are normal. There is no distension.     Palpations: Abdomen is soft. There is no mass.     Tenderness: There is no abdominal tenderness.  Genitourinary:    Penis: Normal.   Musculoskeletal: Normal range of motion.  Lymphadenopathy:     Cervical: No cervical adenopathy.  Skin:    General: Skin is warm and dry.     Findings: No erythema.  Neurological:     Mental Status: He is alert.     Motor: No abnormal muscle tone.  Psychiatric:        Behavior: Behavior normal.        Judgment: Judgment normal.     Prostate exam normal      Assessment & Plan:  Patient to do more extensive blood work within the next 8 weeks Follow-up in 6 months for chronic health issues  Adult wellness-complete.wellness physical was conducted today. Importance of diet and exercise were  discussed in detail.  In addition to this a discussion regarding safety was also covered. We also reviewed over immunizations and gave recommendations regarding current immunization needed for age.  In addition to this additional areas were also touched on including: Preventative health exams needed:  Colonoscopy I have advised colonoscopy he states he will call us back with who his wife uses then he will move forward with this  HTN- Patient was seen today as part of a visit regarding hypertension. The importance of healthy diet and regular physical activity was discussed. The importance of compliance with medications discussed.  Ideal goal is to keep blood pressure low elevated levels certainly below 140/90 when possible.  The patient was counseled that keeping blood pressure under control lessen his risk of complications.  The importance of regular follow-ups was discussed with the patient.  Low-salt diet such as DASH recommended.  Regular physical activity was recommended as well.  Patient was advised to keep regular follow-ups.  The patient was seen today as part of an evaluation regarding hyperlipidemia.  Recent lab work has been reviewed with the patient as well as the goals for good cholesterol care.  In addition to this medications have been discussed the importance of compliance with diet and medications discussed as well.  Finally the patient is aware that poor control of cholesterol, noncompliance can dramatically increase the risk of complications. The patient will keep regular office visits and the patient does agreed to periodic lab work. He believes that his cholesterol profile does not look as good as it should because he relates he has not always been taking the medicine recently because he ran out he feels when he does follow-up blood work in several weeks it will look better  Under significant stress not depressed prefers not to be on medicine counseling measures offered  patient defers at this point-see above  Patient was advised yearly wellness exam

## 2018-04-21 ENCOUNTER — Emergency Department
Admission: EM | Admit: 2018-04-21 | Discharge: 2018-04-21 | Disposition: A | Discharge status: Home / Self Care | Payer: 59 | Attending: Emergency Medicine | Admitting: Emergency Medicine

## 2018-04-21 ENCOUNTER — Other Ambulatory Visit: Payer: Self-pay

## 2018-04-21 DIAGNOSIS — Z79899 Other long term (current) drug therapy: Secondary | ICD-10-CM | POA: Diagnosis not present

## 2018-04-21 DIAGNOSIS — R040 Epistaxis: Secondary | ICD-10-CM | POA: Diagnosis not present

## 2018-04-21 DIAGNOSIS — Z87891 Personal history of nicotine dependence: Secondary | ICD-10-CM | POA: Diagnosis not present

## 2018-04-21 DIAGNOSIS — I10 Essential (primary) hypertension: Secondary | ICD-10-CM | POA: Insufficient documentation

## 2018-04-21 HISTORY — DX: Essential (primary) hypertension: I10

## 2018-04-21 LAB — COMPREHENSIVE METABOLIC PANEL
ALK PHOS: 47 U/L (ref 38–126)
ALT: 38 U/L (ref 0–44)
ANION GAP: 8 (ref 5–15)
AST: 33 U/L (ref 15–41)
Albumin: 4.8 g/dL (ref 3.5–5.0)
BUN: 17 mg/dL (ref 6–20)
CALCIUM: 9.8 mg/dL (ref 8.9–10.3)
CO2: 25 mmol/L (ref 22–32)
Chloride: 103 mmol/L (ref 98–111)
Creatinine, Ser: 0.8 mg/dL (ref 0.61–1.24)
GFR calc non Af Amer: >60 mL/min (ref >60)
Glucose, Bld: 102 mg/dL — ABNORMAL HIGH (ref 70–99)
Potassium: 4.3 mmol/L (ref 3.5–5.1)
SODIUM: 136 mmol/L (ref 135–145)
TOTAL PROTEIN: 7.9 g/dL (ref 6.5–8.1)
Total Bilirubin: 0.7 mg/dL (ref 0.3–1.2)

## 2018-04-21 LAB — CBC WITH DIFFERENTIAL/PLATELET
Abs Immature Granulocytes: 0.02 10*3/uL (ref 0.00–0.07)
BASOS PCT: 1 %
Basophils Absolute: 0.1 10*3/uL (ref 0.0–0.1)
EOS ABS: 0.1 10*3/uL (ref 0.0–0.5)
Eosinophils Relative: 1 %
HCT: 45.5 % (ref 39.0–52.0)
Hemoglobin: 15.8 g/dL (ref 13.0–17.0)
IMMATURE GRANULOCYTES: 0 %
Lymphocytes Relative: 21 %
Lymphs Abs: 1.5 10*3/uL (ref 0.7–4.0)
MCH: 29.8 pg (ref 26.0–34.0)
MCHC: 34.7 g/dL (ref 30.0–36.0)
MCV: 85.7 fL (ref 80.0–100.0)
Monocytes Absolute: 0.6 10*3/uL (ref 0.1–1.0)
Monocytes Relative: 8 %
NEUTROS PCT: 69 %
Neutro Abs: 4.6 10*3/uL (ref 1.7–7.7)
PLATELETS: 234 10*3/uL (ref 150–400)
RBC: 5.31 MIL/uL (ref 4.22–5.81)
RDW: 12.3 % (ref 11.5–15.5)
WBC: 6.8 10*3/uL (ref 4.0–10.5)
nRBC: 0 % (ref 0.0–0.2)

## 2018-04-21 MED ORDER — LIDOCAINE-EPINEPHRINE 1 %-1:100000 IJ SOLN
10.0000 mL | Freq: Once | INTRAMUSCULAR | Status: DC
  Filled 2018-04-21: qty 1

## 2018-04-21 MED ORDER — TRANEXAMIC ACID 1000 MG/10ML IV SOLN
1000.0000 mg | Freq: Once | INTRAVENOUS | Status: DC
  Filled 2018-04-21: qty 10

## 2018-04-21 MED ORDER — OXYMETAZOLINE HCL 0.05 % NA SOLN
1.0000 | Freq: Once | NASAL | Status: AC
Start: 2018-04-21 — End: 2018-04-21
  Administered 2018-04-21: 1 via NASAL
  Filled 2018-04-21: qty 30

## 2018-04-21 NOTE — ED Notes (Signed)
Pharmacy contacted to obtain medication. Medication to be sent from pharmacy to flex tube station

## 2018-04-21 NOTE — ED Notes (Signed)
Explained to pt proper way to stop nosebleed. Pt was holding a towel up to bottom of nose. Informed he needs to pinch bridge of nose. Doesn't like wearing nasal clamp.

## 2018-04-21 NOTE — ED Provider Notes (Signed)
[facility] Emergency Department Provider Note  ____________________________________________  Time seen: Approximately 3:55 PM  I have reviewed the triage vital signs and the nursing notes.   HISTORY  Chief Complaint Epistaxis    HPI James Ochoa is a 58 y.o. male who presents the emergency department complaining of epistaxis for several hours.  Patient reports that he was experiencing a slow continuous nosebleed since this morning.  He states that he does have a history of nosebleeds and this typically occurs when either his blood pressure is up or is under stress.  Patient reports that he has a family stressor with his son.  His son has a drug problem, has been good until approximately a couple weeks and he is feeling significant amounts of stress due to ongoing issues with his son.  Patient reports that he was sneezing and coughing yesterday but did not have any epistaxis.  He has had epistaxis since this morning but he states that it is just a "drip or 2" at a time.  Patient denies any headache, visual changes, neck pain or stiffness, chest pain, shortness of breath, abdominal pain, nausea or vomiting.    Past Medical History:  Diagnosis Date  . Hyperlipidemia 04/15/2018  . Hypertension     Patient Active Problem List   Diagnosis Date Noted  . Hyperlipidemia 04/15/2018  . HTN (hypertension) 10/17/2016    Past Surgical History:  Procedure Laterality Date  . HERNIA REPAIR      Prior to Admission medications   Medication Sig Start Date End Date Taking? Authorizing Provider  lisinopril (PRINIVIL,ZESTRIL) 10 MG tablet ONCE A DAY 04/10/18   Babs Sciara, MD  pravastatin (PRAVACHOL) 20 MG tablet TAKE 1 TABLET BY MOUTH ONCE DAILY 04/10/18   Babs Sciara, MD    Allergies Patient has no known allergies.  History reviewed. No pertinent family history.  Social History Social History   Tobacco Use  . Smoking status: Former Games developer  . Smokeless tobacco:  Never Used  Substance Use Topics  . Alcohol use: Never    Frequency: Never  . Drug use: Not on file     Review of Systems  Constitutional: No fever/chills Eyes: No visual changes. No discharge ENT: Positive for epistaxis Cardiovascular: no chest pain. Respiratory: no cough. No SOB. Gastrointestinal: No abdominal pain.  No nausea, no vomiting.   Musculoskeletal: Negative for musculoskeletal pain. Skin: Negative for rash, abrasions, lacerations, ecchymosis. Neurological: Negative for headaches, focal weakness or numbness. 10-point ROS otherwise negative.  ____________________________________________   PHYSICAL EXAM:  VITAL SIGNS: ED Triage Vitals  Enc Vitals Group     BP 04/21/18 1533 (!) 154/106     Pulse Rate 04/21/18 1531 85     Resp --      Temp 04/21/18 1531 98.5 F (36.9 C)     Temp Source 04/21/18 1531 Oral     SpO2 04/21/18 1531 97 %     Weight 04/21/18 1533 202 lb (91.6 kg)     Height 04/21/18 1533 6\' 2"  (1.88 m)     Head Circumference --      Peak Flow --      Pain Score 04/21/18 1533 0     Pain Loc --      Pain Edu? --      Excl. in GC? --      Constitutional: Alert and oriented. Well appearing and in no acute distress. Eyes: Conjunctivae are normal. PERRL. EOMI. Head: Atraumatic. ENT:  Ears:       Nose: No congestion/rhinnorhea.  Visualization of bilateral nares reveals congealed blood.  Patient is holding a rag with an occasional drip of blood being expressed from the nares.  No significant epistaxis bleeding at this time.  Visualization reveals considerable control of blood but no obvious source of bleeding from Kiesselbach plexus's.      Mouth/Throat: Mucous membranes are moist.  Neck: No stridor.  Neck is supple full range of motion Hematological/Lymphatic/Immunilogical: No cervical lymphadenopathy. Cardiovascular: Normal rate, regular rhythm. Normal S1 and S2.  Good peripheral circulation. Respiratory: Normal respiratory effort without  tachypnea or retractions. Lungs CTAB. Good air entry to the bases with no decreased or absent breath sounds. Musculoskeletal: Full range of motion to all extremities. No gross deformities appreciated. Neurologic:  Normal speech and language. No gross focal neurologic deficits are appreciated.  Skin:  Skin is warm, dry and intact. No rash noted. Psychiatric: Mood and affect are normal. Speech and behavior are normal. Patient exhibits appropriate insight and judgement.   ____________________________________________   LABS (all labs ordered are listed, but only abnormal results are displayed)  Labs Reviewed  COMPREHENSIVE METABOLIC PANEL - Abnormal; Notable for the following components:      Result Value   Glucose, Bld 102 (*)    All other components within normal limits  CBC WITH DIFFERENTIAL/PLATELET   ____________________________________________  EKG   ____________________________________________  RADIOLOGY   No results found.  ____________________________________________    PROCEDURES  Procedure(s) performed:    Procedures    Medications  tranexamic acid (CYKLOKAPRON) injection 1,000 mg (has no administration in time range)  lidocaine-EPINEPHrine (XYLOCAINE W/EPI) 1 %-1:100000 (with pres) injection 10 mL (has no administration in time range)  oxymetazoline (AFRIN) 0.05 % nasal spray 1 spray (1 spray Each Nare Given 04/21/18 1553)     ____________________________________________   INITIAL IMPRESSION / ASSESSMENT AND PLAN / ED COURSE  Pertinent labs & imaging results that were available during my care of the patient were reviewed by me and considered in my medical decision making (see chart for details).  Review of the [place] CSRS was performed in accordance of the [facility] prior to dispensing any controlled drugs.  Clinical Course as of Apr 22 1911  Tue Apr 21, 2018  1557 Patient presents emergency department complaining of epistaxis ongoing since this morning.   Patient reports that it has been a slow continuous bleeding.  Patient reports that he does have history of epistaxis with stress or high blood pressure.  Patient has been under stress recently.  No defined area of bleeding from Kiesselbach plexus.  At this time patient has been given Afrin as well as nose clamp.  I will reevaluate after 15 minutes.  If bleeding persists at that time we will progress to more aggressive treatments.   [JC]  1650 Patient presented with epistaxis.  This was not significant but was continuous.  I have tried Afrin as well as nasal clamp with 15-minute interval with reevaluation.  Area continues to have a slow epistaxis.  At this time, basic labs are checked to ensure no significant anemia or thrombocytopenia.  CBC returns with reassuring results.  At this time, I will attempt cessation with soaking nasal pledgets with TXA and applying with direct pressure for 30 minutes.  We will reevaluate after this attempt.   [JC]  1803 Patient's nares are anesthetized using lidocaine with epi on nasal atomizer.  Rhino Rocket coated with TXA are applied to both nares.  Will  reassess in 30 minutes.   [JC]    Clinical Course User Index [JC] , Delorise Royals D, PA-C     Patient's diagnosis is consistent with acute, anterior epistaxis.  Patient presents emergency department with a nosebleed that have been ongoing for most the day.  While there was no significant bleeding there was a constant slow hemorrhage.  Initial attempts of using nasal clamp and Afrin were unsuccessful in reducing patient's symptoms.  Given ongoing symptoms, 2 Rhino Rocket's were coated using TXA.  These were inserted and left in for 30 minutes.  Patient had a complete cessation of bleeding at this time.  No return of bleeding after another 20 to 30 minutes of observation.  Patient is instructed to use Afrin for any mild recurrence of epistaxis.  If symptoms persist like they did today however patient should return to the  emergency department..  Follow-up with ENT as needed.. Patient is given ED precautions to return to the ED for any worsening or new symptoms.     ____________________________________________  FINAL CLINICAL IMPRESSION(S) / ED DIAGNOSES  Final diagnoses:  Acute anterior epistaxis      NEW MEDICATIONS STARTED DURING THIS VISIT:  ED Discharge Orders    None          This chart was dictated using voice recognition software/Dragon. Despite best efforts to proofread, errors can occur which can change the meaning. Any change was purely unintentional.    Racheal Patches,  D, PA-C 04/21/18 1913    Jeanmarie PlantMcShane, James A, MD 04/21/18 2132

## 2018-04-21 NOTE — ED Triage Notes (Signed)
Nosebleed that began today. Denies blood thinners. States a lot of sneezing yesterday. States gets nosebleeds with increase stress and states son is a drug user and has been causing him a lot of stress. Hx HTN.

## 2018-04-24 ENCOUNTER — Encounter: Payer: Self-pay | Admitting: Family Medicine

## 2018-04-24 ENCOUNTER — Ambulatory Visit: Payer: 59 | Admitting: Family Medicine

## 2018-04-24 VITALS — BP 142/92 | Wt 206.4 lb

## 2018-04-24 DIAGNOSIS — F419 Anxiety disorder, unspecified: Secondary | ICD-10-CM | POA: Insufficient documentation

## 2018-04-24 DIAGNOSIS — I10 Essential (primary) hypertension: Secondary | ICD-10-CM | POA: Diagnosis not present

## 2018-04-24 MED ORDER — LISINOPRIL 20 MG PO TABS: 20.0000 mg | ORAL_TABLET | Freq: Every day | ORAL | 2 refills | Status: DC

## 2018-04-24 NOTE — Patient Instructions (Addendum)
SSRI medication for anxiety like zoloft, lexapro, prozac

## 2018-04-24 NOTE — Progress Notes (Signed)
Subjective:    Patient ID: James Ochoa, male    DOB: [birth year], 58 y.o.   MRN: [id number]  Hypertension  This is a chronic problem. Pertinent negatives include no chest pain, headaches or shortness of breath. There are no compliance problems.    Pt states he went to [facility] ER on 04/21/2018 for nose bleed, states he tried to get it to stop for 3 hrs prior to going to ED; was in ED for 4 hours. BP at ER was 154/106 and pt states BP became worse.   Pt states he does take his medication as directed, rarely misses dose. Pt states he does have some increased stress due to son. Reports a lot of anxiety/stress d/t son using drugs; brother recently had a liver transplant doing well. Talks to pastor, no counseling. Typically exercises often - hasn't been able to recently d/t weather.   States last night BP 170-180/110    Review of Systems  Eyes: Negative for visual disturbance.  Respiratory: Negative for shortness of breath.   Cardiovascular: Negative for chest pain.  Neurological: Negative for dizziness, syncope and headaches.  Psychiatric/Behavioral: Negative for suicidal ideas. The patient is nervous/anxious.        Objective:   Physical Exam Vitals signs and nursing note reviewed.  Constitutional:      General: He is not in acute distress.    Appearance: He is well-developed.  HENT:     Head: Normocephalic and atraumatic.  Neck:     Musculoskeletal: Neck supple.  Cardiovascular:     Rate and Rhythm: Normal rate and regular rhythm.     Heart sounds: Normal heart sounds. No murmur.  Pulmonary:     Effort: Pulmonary effort is normal. No respiratory distress.     Breath sounds: Normal breath sounds.  Skin:    General: Skin is warm and dry.  Neurological:     Mental Status: He is alert and oriented to person, place, and time.  Psychiatric:        Mood and Affect: Mood normal.        Behavior: Behavior normal.           Assessment & Plan:  1. Essential  hypertension BP elevated in recent days likely d/t stress and anxiety per patient. BP elevated on repeat. Will increase lisinopril to 20 mg daily. DASH diet recommended. Encouraged to get back into his exercising as this helps relieve stress for him as well. Continue logging home BP and bring in at f/u appt. Warning signs discussed. Recommend f/u in 3 months with Dr. Lorin Picket.   2. Anxiety Pt was recently seen on 2/12 with Dr. Lorin Picket for wellness and at that time filled out GAD 7 and PHQ 9 - reviewed these today. Appears they discussed options of medication and counseling then as well and pt declined treatment then. Lengthy discussion with patient regarding anxiety symptoms and treatment options. Strongly recommended counseling and starting on a SSRI. Discussed with pt SSRIs are used to treat both depression and anxiety, we like to avoid use of benzodiazepines if possible. Pt declines offer of referral for counseling and starting of medication. A list of SSRI options were provided to patient and he states he would like to read more information on these before deciding to start something. He is not suicidal, he understands that if he were to develop SI/HI he should seek emergency care. Discussed with patient stress reduction techniques and if he decides he wants to start on medication  he should let the office know and f/u with Dr. Lorin Picket on this.   Dr. Lubertha South was consulted on this case and is in agreement with the above treatment plan.  25 minutes was spent with the patient.  This statement verifies that 25 minutes was indeed spent with the patient.  More than 50% of this visit-total duration of the visit-was spent in counseling and coordination of care. The issues that the patient came in for today as reflected in the diagnosis (s) please refer to documentation for further details.\

## 2018-04-28 DIAGNOSIS — Z029 Encounter for administrative examinations, unspecified: Secondary | ICD-10-CM

## 2018-04-29 ENCOUNTER — Telehealth: Payer: Self-pay | Admitting: Family Medicine

## 2018-04-29 NOTE — Telephone Encounter (Signed)
Please advise. Thank you

## 2018-04-29 NOTE — Telephone Encounter (Signed)
Form in provider office. Please advise. Thank you. 

## 2018-04-29 NOTE — Telephone Encounter (Signed)
Message for James Ochoa) patient was seen 2/21 for follow up from ER visit on nose bleed from 2/18.Patient had FMLA faxed over to be filled out.I filled in what I could please review and fill in highlighted areas ,date and sign in your chair in office.

## 2018-04-29 NOTE — Telephone Encounter (Signed)
Form has been faxed over to [employer] and copy was mailed to patient.

## 2018-04-29 NOTE — Telephone Encounter (Signed)
FMLA form filled out and signed. Ready on top of bookshelf in office. Thanks.

## 2018-06-10 MED FILL — PRAVASTATIN SODIUM 20 MG TA: 20 | 90 days supply | Qty: 90 | Fill #0

## 2018-07-20 DIAGNOSIS — H43393 Other vitreous opacities, bilateral: Secondary | ICD-10-CM | POA: Diagnosis not present

## 2018-07-24 ENCOUNTER — Ambulatory Visit: Payer: 59 | Admitting: Family Medicine

## 2018-08-21 ENCOUNTER — Telehealth: Payer: Self-pay | Admitting: Family Medicine

## 2018-08-21 NOTE — Telephone Encounter (Signed)
Pharmacy requesting refill on Lisinopril 10 mg tablet. Take one tablet by mouth daily. Pt last seen 04/24/2018 for HTN. Please advise. Thank you

## 2018-08-22 NOTE — Telephone Encounter (Signed)
May have 90 day Follow up in August

## 2018-08-24 MED ORDER — LISINOPRIL 20 MG PO TABS: 20.0000 mg | ORAL_TABLET | Freq: Every day | ORAL | 2 refills | Status: DC

## 2018-08-24 NOTE — Addendum Note (Signed)
Addended by: Dairl Ponder on: 08/24/2018 10:06 AM   Modules accepted: Orders

## 2018-08-24 NOTE — Telephone Encounter (Signed)
Patient was increased to Lisinopril 20 mg at office visit 04/23/2018- Prescription sent electronically to pharmacy

## 2018-10-14 ENCOUNTER — Other Ambulatory Visit: Payer: Self-pay

## 2018-10-14 ENCOUNTER — Encounter: Payer: Self-pay | Admitting: Family Medicine

## 2018-10-14 ENCOUNTER — Ambulatory Visit (INDEPENDENT_AMBULATORY_CARE_PROVIDER_SITE_OTHER): Payer: 59 | Admitting: Family Medicine

## 2018-10-14 VITALS — BP 117/74 | Ht 73.5 in | Wt 206.0 lb

## 2018-10-14 DIAGNOSIS — I10 Essential (primary) hypertension: Secondary | ICD-10-CM | POA: Diagnosis not present

## 2018-10-14 DIAGNOSIS — E782 Mixed hyperlipidemia: Secondary | ICD-10-CM

## 2018-10-14 MED ORDER — LISINOPRIL 20 MG PO TABS: 20.0000 mg | ORAL_TABLET | Freq: Every day | ORAL | 1 refills | Status: DC

## 2018-10-14 MED ORDER — PRAVASTATIN SODIUM 20 MG PO TABS: 20.0000 mg | ORAL_TABLET | Freq: Every day | ORAL | 1 refills | Status: DC

## 2018-10-14 NOTE — Progress Notes (Signed)
Subjective:    Patient ID: James Ochoa, male    DOB: 1960-08-24, 58 y.o.   MRN: [id number]  Hypertension This is a chronic problem. The current episode started more than 1 year ago. Pertinent negatives include no chest pain, headaches or shortness of breath. Risk factors for coronary artery disease include dyslipidemia and male gender. Treatments tried: lisinopril. There are no compliance problems.   Ongoing history of hypertension takes his medication Has cholesterol issues watch his diet recently ran out of medicine Is exercising on a regular basis  The patient relates that he has been doing a good job taking his medicine but somehow his cholesterol medicine is not being sent to him he is trying to watch his diet blood pressure is been good watching salt in diet  Patient for blood pressure check up.  The patient does have hypertension.  The patient is on medication.  Patient relates compliance with meds. Todays BP reviewed with the patient. Patient denies issues with medication. Patient relates reasonable diet. Patient tries to minimize salt. Patient aware of BP goals.  Patient here for follow-up regarding cholesterol.  The patient does have hyperlipidemia.  Patient does try to maintain a reasonable diet.  Patient does take the medication on a regular basis.  Denies missing a dose.  The patient denies any obvious side effects.  Prior blood work results reviewed with the patient.  The patient is aware of his cholesterol goals and the need to keep it under good control to lessen the risk of disease.  Review of Systems  Constitutional: Negative for diaphoresis and fatigue.  HENT: Negative for congestion and rhinorrhea.   Respiratory: Negative for cough and shortness of breath.   Cardiovascular: Negative for chest pain and leg swelling.  Gastrointestinal: Negative for abdominal pain and diarrhea.  Skin: Negative for color change and rash.  Neurological: Negative for dizziness and headaches.   Psychiatric/Behavioral: Negative for behavioral problems and confusion.   Virtual Visit via Video Note  I connected with James HewsNoble W Vea on 10/14/18 at  2:00 PM EDT by a video enabled telemedicine application and verified that I am speaking with the correct person using two identifiers.  Location: Patient: home Provider: office   I discussed the limitations of evaluation and management by telemedicine and the availability of in person appointments. The patient expressed understanding and agreed to proceed.  History of Present Illness:    Observations/Objective:   Assessment and Plan:   Follow Up Instructions:    I discussed the assessment and treatment plan with the patient. The patient was provided an opportunity to ask questions and all were answered. The patient agreed with the plan and demonstrated an understanding of the instructions.   The patient was advised to call back or seek an in-person evaluation if the symptoms worsen or if the condition fails to improve as anticipated.  I provided 15 minutes of non-face-to-face time during this encounter.        Objective:   Physical Exam  Today's visit was via telephone Physical exam was not possible for this visit       Assessment & Plan:  HTN- Patient was seen today as part of a visit regarding hypertension. The importance of healthy diet and regular physical activity was discussed. The importance of compliance with medications discussed.  Ideal goal is to keep blood pressure low elevated levels certainly below 140/90 when possible.  The patient was counseled that keeping blood pressure under control lessen his risk of  complications.  The importance of regular follow-ups was discussed with the patient.  Low-salt diet such as DASH recommended.  Regular physical activity was recommended as well.  Patient was advised to keep regular follow-ups.  Patient will watch cholesterol in his diet try to stay physically active  will take his medicine new medicine and  We will do lab work this fall will do a follow-up office visit for wellness no later than February or March  Colonoscopy recommended patient will think about it

## 2018-10-23 ENCOUNTER — Other Ambulatory Visit: Payer: Self-pay

## 2018-10-23 ENCOUNTER — Ambulatory Visit (INDEPENDENT_AMBULATORY_CARE_PROVIDER_SITE_OTHER): Payer: 59 | Admitting: Family Medicine

## 2018-10-23 DIAGNOSIS — U071 COVID-19: Secondary | ICD-10-CM

## 2018-10-23 NOTE — Progress Notes (Signed)
   Subjective:  Audio plus video  Patient ID: James Ochoa, male    DOB: Apr 01, 1960, 58 y.o.   MRN: [id number]  HPI  Patient calls to discuss the results of his recent Covid test. Patient states he was called a few hours ago that his Covid test came back positive and he has some questions and concerns.  Virtual Visit via Video Note  I connected with James Ochoa on 10/23/18 at  3:50 PM EDT by a video enabled telemedicine application and verified that I am speaking with the correct person using two identifiers.  Location: Patient: home Provider: office   I discussed the limitations of evaluation and management by telemedicine and the availability of in person appointments. The patient expressed understanding and agreed to proceed.  History of Present Illness:    Observations/Objective:   Assessment and Plan:   Follow Up Instructions:    I discussed the assessment and treatment plan with the patient. The patient was provided an opportunity to ask questions and all were answered. The patient agreed with the plan and demonstrated an understanding of the instructions.   The patient was advised to call back or seek an in-person evaluation if the symptoms worsen or if the condition fails to improve as anticipated.  I provided 25 minutes of non-face-to-face time during this encounter.  Patient was just diagnosed earlier today as COVID-19 positive for rather advised today that his test became positive.  Patient started having symptoms actually last week and.  Some congestion stuffiness came on.  He thought it might be a mild cold or allergy.  His son through the industrial workplace was identified positive at the start of the week.  His son lives in the home.  Patient then was tested through employee health.  This returned positive today.  Patient reports diffuse mild headache.  Some element of congestion.  Slight sore throat.  Notes some cough.  Notes also a sense of congestion in  his chest when he is resting.  This makes him somewhat anxious.  He works in a Production [employer], [employer] and is savvy medically speaking and realizes represents likely lower respiratory tract involvement     Review of Systems No headache, no major weight loss or weight gain, no chest pain no back pain abdominal pain no change in bowel habits complete ROS otherwise negative     Objective:   Physical Exam   Virtual     Assessment & Plan:  Impression COVID-19.  Long discussion held.  Patient able to speak in complete sentences.  No apparent distress.  In fact when walking today.  I have advised him we do not need to go the emergency room tonight to take this to the next level.  Warning signs discussed very carefully.  Patient likely does have an element of lower respiratory tract involvement.  Petra Kuba of this discussed.  Hopefully this will remain stable.  Potential for worsening discussed.  Symptom care discussed.  Quarantine management discussed.  Employee health interactions discussed.  Numerous questions answered.  Greater than 50% of this 25 minute face to face visit was spent in counseling and discussion and coordination of care regarding the above diagnosis/diagnosies

## 2018-10-24 ENCOUNTER — Encounter: Payer: Self-pay | Admitting: Family Medicine

## 2018-11-02 ENCOUNTER — Ambulatory Visit (INDEPENDENT_AMBULATORY_CARE_PROVIDER_SITE_OTHER): Payer: 59 | Admitting: Family Medicine

## 2018-11-02 ENCOUNTER — Encounter: Payer: Self-pay | Admitting: Family Medicine

## 2018-11-02 ENCOUNTER — Other Ambulatory Visit: Payer: Self-pay

## 2018-11-02 DIAGNOSIS — U071 COVID-19: Secondary | ICD-10-CM

## 2018-11-02 MED ORDER — AZITHROMYCIN 250 MG PO TABS: ORAL_TABLET | ORAL | 0 refills | Status: DC

## 2018-11-02 MED ORDER — AMOXICILLIN 500 MG PO CAPS: ORAL_CAPSULE | ORAL | 0 refills | Status: DC

## 2018-11-02 NOTE — Progress Notes (Signed)
   Subjective:    Patient ID: James Ochoa, male    DOB: 07/04/1960, 58 y.o.   MRN: [id number]  [place] having trouble with allergies and then started having fever last Sunday and coughing up some blood. Test for covid was positive on the 21st per pt. Pt states he feels better but lungs feel sore. Last night around 9pm started having chills and fever returned.   Virtual Visit via Telephone Note  I connected with James Ochoa on 11/02/18 at 11:30 AM EDT by telephone and verified that I am speaking with the correct person using two identifiers.  Location: Patient: home Provider: office   I discussed the limitations, risks, security and privacy concerns of performing an evaluation and management service by telephone and the availability of in person appointments. I also discussed with the patient that there may be a patient responsible charge related to this service. The patient expressed understanding and agreed to proceed.   History of Present Illness:    Observations/Objective:   Assessment and Plan:   Follow Up Instructions:    I discussed the assessment and treatment plan with the patient. The patient was provided an opportunity to ask questions and all were answered. The patient agreed with the plan and demonstrated an understanding of the instructions.   The patient was advised to call back or seek an in-person evaluation if the symptoms worsen or if the condition fails to improve as anticipated.  I provided 18 minutes of non-face-to-face time during this encounter.    Patient reports no shortness of breath.  Energy level overall better.  Feeling better.  Just concerned about the reemergence of fever.  Appetite still good.    Review of Systems No headache, no major weight loss or weight gain, no chest pain no back pain abdominal pain no change in bowel habits complete ROS otherwise negative     Objective:   Physical Exam  Virtual      Assessment & Plan:   Impression confirmed COVID-19 with now reemergence of symptoms around day 10.  With fever.  Cough occasionally productive.  I encouraged an emergency room visit but patient declined since he stated overall feeling better and not short of breath.  Will cover with Z-Pak and amoxicillin for potential secondary bacterial pneumonia.  Warning signs discussed carefully recheck in 48 hours

## 2018-11-04 ENCOUNTER — Other Ambulatory Visit: Payer: Self-pay

## 2018-11-04 ENCOUNTER — Encounter: Payer: Self-pay | Admitting: Family Medicine

## 2018-11-04 ENCOUNTER — Ambulatory Visit (INDEPENDENT_AMBULATORY_CARE_PROVIDER_SITE_OTHER): Payer: 59 | Admitting: Family Medicine

## 2018-11-04 DIAGNOSIS — U071 COVID-19: Secondary | ICD-10-CM | POA: Diagnosis not present

## 2018-11-04 NOTE — Progress Notes (Signed)
   Subjective:  Audio  Patient ID: James Ochoa, male    DOB: [birth year], 58 y.o.   MRN: [id number]  HPIfollow up on pneumonia. Pt states he is doing much better. No fever since Monday, no sob. Taking amoxil and a zpack and having burning in his stomach and diarrhea.   Virtual Visit via Telephone Note  I connected with James Ochoa on 11/04/18 at 10:00 AM EDT by telephone and verified that I am speaking with the correct person using two identifiers.  Location: Patient: home Provider: office   I discussed the limitations, risks, security and privacy concerns of performing an evaluation and management service by telephone and the availability of in person appointments. I also discussed with the patient that there may be a patient responsible charge related to this service. The patient expressed understanding and agreed to proceed.   History of Present Illness:    Observations/Objective:   Assessment and Plan:   Follow Up Instructions:    I discussed the assessment and treatment plan with the patient. The patient was provided an opportunity to ask questions and all were answered. The patient agreed with the plan and demonstrated an understanding of the instructions.   The patient was advised to call back or seek an in-person evaluation if the symptoms worsen or if the condition fails to improve as anticipated.  I provided 19 minutes of non-face-to-face time during this encounter.  Patient states overall shortness of breath and congestion have improved markedly.  Feels that Z-Pak plus amoxicillin is definitely help.  No noticeable shortness of breath at this time.  Fever went away yesterday.  Is experiencing GI symptoms he feels related likely to the antibiotics.  Sensitive stomach in the past antibiotics.  Overall appetite good .     Review of Systems No headache no chest pain no back pain no rash    Objective:   Physical Exam  Virtual      Assessment & Plan:   Impression COVID-19 infection with subsequent secondary bacterial chest infection.  Patient declined ER visit back on Monday.  Overall now better on current meds.  Warning signs discussed.  May be ready to return to work next Monday if no fever for the prior 3 days.  And substantially improved symptomatology.  Discussed

## 2018-12-16 ENCOUNTER — Ambulatory Visit: Admission: EM | Admit: 2018-12-16 | Discharge: 2018-12-16 | Discharge status: Against Medical Advice | Payer: 59

## 2018-12-16 ENCOUNTER — Other Ambulatory Visit: Payer: Self-pay

## 2019-03-15 DIAGNOSIS — J340 Abscess, furuncle and carbuncle of nose: Secondary | ICD-10-CM | POA: Diagnosis not present

## 2019-03-15 DIAGNOSIS — R040 Epistaxis: Secondary | ICD-10-CM | POA: Diagnosis not present

## 2019-03-18 ENCOUNTER — Telehealth: Payer: Self-pay | Admitting: Family Medicine

## 2019-03-18 NOTE — Telephone Encounter (Signed)
Patient had FMLA faxed over to be updated on nose bleed but you havent seen him since 04/24/2018. He went to see specialist for his nose bleed and he did minor surgery to help with the bleed. Please advise on form . I have no notes to go by to complete form.

## 2019-03-21 NOTE — Telephone Encounter (Signed)
It would be best for this patient to get this through the specialist who provided care thank you

## 2019-03-22 NOTE — Telephone Encounter (Signed)
Spoke with patient to let him know that specialist needs to handle FMLA so he ask me to mailed them to him.

## 2019-04-14 ENCOUNTER — Other Ambulatory Visit: Payer: Self-pay

## 2019-04-14 ENCOUNTER — Ambulatory Visit (INDEPENDENT_AMBULATORY_CARE_PROVIDER_SITE_OTHER): Payer: 59 | Admitting: Family Medicine

## 2019-04-14 ENCOUNTER — Encounter: Payer: Self-pay | Admitting: Family Medicine

## 2019-04-14 VITALS — BP 124/75 | Ht 73.5 in | Wt 203.0 lb

## 2019-04-14 DIAGNOSIS — Z125 Encounter for screening for malignant neoplasm of prostate: Secondary | ICD-10-CM | POA: Diagnosis not present

## 2019-04-14 DIAGNOSIS — I10 Essential (primary) hypertension: Secondary | ICD-10-CM | POA: Diagnosis not present

## 2019-04-14 DIAGNOSIS — Z79899 Other long term (current) drug therapy: Secondary | ICD-10-CM | POA: Diagnosis not present

## 2019-04-14 DIAGNOSIS — E782 Mixed hyperlipidemia: Secondary | ICD-10-CM | POA: Diagnosis not present

## 2019-04-14 MED ORDER — LISINOPRIL 20 MG PO TABS: 20.0000 mg | ORAL_TABLET | Freq: Every day | ORAL | 1 refills | Status: DC

## 2019-04-14 MED ORDER — PRAVASTATIN SODIUM 20 MG PO TABS: 20.0000 mg | ORAL_TABLET | Freq: Every day | ORAL | 1 refills | Status: DC

## 2019-04-14 NOTE — Addendum Note (Signed)
Addended by: Metro Kung on: 04/14/2019 01:28 PM   Modules accepted: Orders

## 2019-04-14 NOTE — Progress Notes (Signed)
   Subjective:    Patient ID: James Ochoa, male    DOB: 13-Feb-1961, 59 y.o.   MRN: [id number]  Hypertension This is a chronic problem. Pertinent negatives include no chest pain, headaches or shortness of breath. Treatments tried: lisinopril. There are no compliance problems (takes med every day, eats healthy for the most part, tries to exercise daily).   Patient does exercise watch diet takes his medicine regular basis denies any setbacks or problems Pt had covid back in august. And pt states he was told to take covid vaccine anyways and he was sick and out of work for 3 days. Pt wonders if he should take the second dose.  Long discussion with the patient overall he did have significant side effects from the shot but I encouraged him to get the second shot Virtual Visit via Telephone Note  I connected with James Ochoa on 04/14/19 at 11:00 AM EST by telephone and verified that I am speaking with the correct person using two identifiers.  Location: Patient: home Provider: office   I discussed the limitations, risks, security and privacy concerns of performing an evaluation and management service by telephone and the availability of in person appointments. I also discussed with the patient that there may be a patient responsible charge related to this service. The patient expressed understanding and agreed to proceed.   History of Present Illness:    Observations/Objective:   Assessment and Plan:   Follow Up Instructions:    I discussed the assessment and treatment plan with the patient. The patient was provided an opportunity to ask questions and all were answered. The patient agreed with the plan and demonstrated an understanding of the instructions.   The patient was advised to call back or seek an in-person evaluation if the symptoms worsen or if the condition fails to improve as anticipated.  I provided 19 minutes of non-face-to-face time during this encounter.     Review of  Systems  Constitutional: Negative for activity change, appetite change and fatigue.  HENT: Negative for congestion and rhinorrhea.   Respiratory: Negative for cough and shortness of breath.   Cardiovascular: Negative for chest pain and leg swelling.  Gastrointestinal: Negative for abdominal pain, nausea and vomiting.  Neurological: Negative for dizziness and headaches.  Psychiatric/Behavioral: Negative for agitation and behavioral problems.       Objective:   Physical Exam  Today's visit was via telephone Physical exam was not possible for this visit   Patient defers on colonoscopy until later this year    Assessment & Plan:  HTN continue current medication continue exercise and minimize salt check lab work Hyperlipidemia continue medication check lab work await results Wellness exam sometime later this spring or summer I do recommend for the patient to get the second Covid shot despite having some side effects

## 2019-04-26 DIAGNOSIS — L509 Urticaria, unspecified: Secondary | ICD-10-CM | POA: Diagnosis not present

## 2019-04-27 DIAGNOSIS — L509 Urticaria, unspecified: Secondary | ICD-10-CM | POA: Diagnosis not present

## 2019-04-27 DIAGNOSIS — J029 Acute pharyngitis, unspecified: Secondary | ICD-10-CM | POA: Diagnosis not present

## 2019-05-12 DIAGNOSIS — J3489 Other specified disorders of nose and nasal sinuses: Secondary | ICD-10-CM | POA: Diagnosis not present

## 2019-05-12 DIAGNOSIS — J342 Deviated nasal septum: Secondary | ICD-10-CM | POA: Diagnosis not present

## 2019-05-12 DIAGNOSIS — J340 Abscess, furuncle and carbuncle of nose: Secondary | ICD-10-CM | POA: Diagnosis not present

## 2019-10-29 ENCOUNTER — Encounter: Payer: Self-pay | Admitting: Family Medicine

## 2019-10-29 ENCOUNTER — Other Ambulatory Visit: Payer: Self-pay | Admitting: Family Medicine

## 2019-10-29 ENCOUNTER — Ambulatory Visit (INDEPENDENT_AMBULATORY_CARE_PROVIDER_SITE_OTHER): Payer: 59 | Admitting: Family Medicine

## 2019-10-29 ENCOUNTER — Other Ambulatory Visit: Payer: Self-pay

## 2019-10-29 VITALS — BP 128/72 | Temp 97.7°F | Wt 205.0 lb

## 2019-10-29 DIAGNOSIS — E782 Mixed hyperlipidemia: Secondary | ICD-10-CM | POA: Diagnosis not present

## 2019-10-29 DIAGNOSIS — Z79899 Other long term (current) drug therapy: Secondary | ICD-10-CM | POA: Diagnosis not present

## 2019-10-29 DIAGNOSIS — Z125 Encounter for screening for malignant neoplasm of prostate: Secondary | ICD-10-CM | POA: Diagnosis not present

## 2019-10-29 DIAGNOSIS — I10 Essential (primary) hypertension: Secondary | ICD-10-CM

## 2019-10-29 MED ORDER — PRAVASTATIN SODIUM 20 MG PO TABS
20.0000 mg | ORAL_TABLET | Freq: Every day | ORAL | 1 refills | Status: DC
Start: 2019-10-29 — End: 2020-05-29

## 2019-10-29 MED ORDER — LISINOPRIL 20 MG PO TABS
20.0000 mg | ORAL_TABLET | Freq: Every day | ORAL | 1 refills | Status: DC
Start: 2019-10-29 — End: 2020-05-29

## 2019-10-29 NOTE — Progress Notes (Signed)
° °  Subjective:    Patient ID: James Ochoa, male    DOB: 09-25-1960, 59 y.o.   MRN: [id number]  Hypertension This is a chronic problem. Pertinent negatives include no chest pain, headaches or shortness of breath. Treatments tried: Lisinopril. There are no compliance problems.    Patient has questions regarding 2nd covid vaccine.  Patient apparently had some hives 10 days after getting the first vaccine.  No breathing difficulties no swallowing difficulties it went away with prednisone although it is possible it could be related to the first vaccine shot it is also possible it could be related to something else  Review of Systems  Constitutional: Negative for activity change, fatigue and fever.  HENT: Negative for congestion and rhinorrhea.   Respiratory: Negative for cough and shortness of breath.   Cardiovascular: Negative for chest pain and leg swelling.  Gastrointestinal: Negative for abdominal pain, diarrhea and nausea.  Genitourinary: Negative for dysuria and hematuria.  Neurological: Negative for weakness and headaches.  Psychiatric/Behavioral: Negative for agitation and behavioral problems.       Objective:   Physical Exam Vitals reviewed.  Constitutional:      General: He is not in acute distress. HENT:     Head: Normocephalic and atraumatic.  Eyes:     General:        Right eye: No discharge.        Left eye: No discharge.  Neck:     Trachea: No tracheal deviation.  Cardiovascular:     Rate and Rhythm: Normal rate and regular rhythm.     Heart sounds: Normal heart sounds. No murmur heard.   Pulmonary:     Effort: Pulmonary effort is normal. No respiratory distress.     Breath sounds: Normal breath sounds.  Lymphadenopathy:     Cervical: No cervical adenopathy.  Skin:    General: Skin is warm and dry.  Neurological:     Mental Status: He is alert.     Coordination: Coordination normal.  Psychiatric:        Behavior: Behavior normal.    Very nice  patient Patient is exercising regular basis patient watching diet       Assessment & Plan:  1. Essential hypertension HTN- patient seen for follow-up regarding HTN.  Diet, medication compliance, appropriate labs and refills were completed.  Importance of keeping blood pressure under good control to lessen the risk of complications discussed  - Basic metabolic panel  2. Mixed hyperlipidemia Hyperlipidemia-importance of diet, weight control, activity, compliance with medications discussed.  Recent labs reviewed.  Any additional labs or refills ordered.  Importance of keeping under good control discussed.  - Lipid panel  3. Screening for prostate cancer Screening - PSA  4. High risk medication use Screening - Hepatic function panel Follow-up in the spring

## 2019-10-29 NOTE — Patient Instructions (Signed)
DASH Eating Plan DASH stands for "Dietary Approaches to Stop Hypertension." The DASH eating plan is a healthy eating plan that has been shown to reduce high blood pressure (hypertension). It may also reduce your risk for type 2 diabetes, heart disease, and stroke. The DASH eating plan may also help with weight loss. What are tips for following this plan?  General guidelines  Avoid eating more than 2,300 mg (milligrams) of salt (sodium) a day. If you have hypertension, you may need to reduce your sodium intake to 1,500 mg a day.  Limit alcohol intake to no more than 1 drink a day for nonpregnant women and 2 drinks a day for men. One drink equals 12 oz of beer, 5 oz of wine, or 1 oz of hard liquor.  Work with your health care provider to maintain a healthy body weight or to lose weight. Ask what an ideal weight is for you.  Get at least 30 minutes of exercise that causes your heart to beat faster (aerobic exercise) most days of the week. Activities may include walking, swimming, or biking.  Work with your health care provider or diet and nutrition specialist (dietitian) to adjust your eating plan to your individual calorie needs. Reading food labels   Check food labels for the amount of sodium per serving. Choose foods with less than 5 percent of the Daily Value of sodium. Generally, foods with less than 300 mg of sodium per serving fit into this eating plan.  To find whole grains, look for the word "whole" as the first word in the ingredient list. Shopping  Buy products labeled as "low-sodium" or "no salt added."  Buy fresh foods. Avoid canned foods and premade or frozen meals. Cooking  Avoid adding salt when cooking. Use salt-free seasonings or herbs instead of table salt or sea salt. Check with your health care provider or pharmacist before using salt substitutes.  Do not fry foods. Cook foods using healthy methods such as baking, boiling, grilling, and broiling instead.  Cook with  heart-healthy oils, such as olive, canola, soybean, or sunflower oil. Meal planning  Eat a balanced diet that includes: ? 5 or more servings of fruits and vegetables each day. At each meal, try to fill half of your plate with fruits and vegetables. ? Up to 6-8 servings of whole grains each day. ? Less than 6 oz of lean meat, poultry, or fish each day. A 3-oz serving of meat is about the same size as a deck of cards. One egg equals 1 oz. ? 2 servings of low-fat dairy each day. ? A serving of nuts, seeds, or beans 5 times each week. ? Heart-healthy fats. Healthy fats called Omega-3 fatty acids are found in foods such as flaxseeds and coldwater fish, like sardines, salmon, and mackerel.  Limit how much you eat of the following: ? Canned or prepackaged foods. ? Food that is high in trans fat, such as fried foods. ? Food that is high in saturated fat, such as fatty meat. ? Sweets, desserts, sugary drinks, and other foods with added sugar. ? Full-fat dairy products.  Do not salt foods before eating.  Try to eat at least 2 vegetarian meals each week.  Eat more home-cooked food and less restaurant, buffet, and fast food.  When eating at a restaurant, ask that your food be prepared with less salt or no salt, if possible. What foods are recommended? The items listed may not be a complete list. Talk with your dietitian about   what dietary choices are best for you. Grains Whole-grain or whole-wheat bread. Whole-grain or whole-wheat pasta. Brown rice. Oatmeal. Quinoa. Bulgur. Whole-grain and low-sodium cereals. Pita bread. Low-fat, low-sodium crackers. Whole-wheat flour tortillas. Vegetables Fresh or frozen vegetables (raw, steamed, roasted, or grilled). Low-sodium or reduced-sodium tomato and vegetable juice. Low-sodium or reduced-sodium tomato sauce and tomato paste. Low-sodium or reduced-sodium canned vegetables. Fruits All fresh, dried, or frozen fruit. Canned fruit in natural juice (without  added sugar). Meat and other protein foods Skinless chicken or turkey. Ground chicken or turkey. Pork with fat trimmed off. Fish and seafood. Egg whites. Dried beans, peas, or lentils. Unsalted nuts, nut butters, and seeds. Unsalted canned beans. Lean cuts of beef with fat trimmed off. Low-sodium, lean deli meat. Dairy Low-fat (1%) or fat-free (skim) milk. Fat-free, low-fat, or reduced-fat cheeses. Nonfat, low-sodium ricotta or cottage cheese. Low-fat or nonfat yogurt. Low-fat, low-sodium cheese. Fats and oils Soft margarine without trans fats. Vegetable oil. Low-fat, reduced-fat, or light mayonnaise and salad dressings (reduced-sodium). Canola, safflower, olive, soybean, and sunflower oils. Avocado. Seasoning and other foods Herbs. Spices. Seasoning mixes without salt. Unsalted popcorn and pretzels. Fat-free sweets. What foods are not recommended? The items listed may not be a complete list. Talk with your dietitian about what dietary choices are best for you. Grains Baked goods made with fat, such as croissants, muffins, or some breads. Dry pasta or rice meal packs. Vegetables Creamed or fried vegetables. Vegetables in a cheese sauce. Regular canned vegetables (not low-sodium or reduced-sodium). Regular canned tomato sauce and paste (not low-sodium or reduced-sodium). Regular tomato and vegetable juice (not low-sodium or reduced-sodium). Pickles. Olives. Fruits Canned fruit in a light or heavy syrup. Fried fruit. Fruit in cream or butter sauce. Meat and other protein foods Fatty cuts of meat. Ribs. Fried meat. Bacon. Sausage. Bologna and other processed lunch meats. Salami. Fatback. Hotdogs. Bratwurst. Salted nuts and seeds. Canned beans with added salt. Canned or smoked fish. Whole eggs or egg yolks. Chicken or turkey with skin. Dairy Whole or 2% milk, cream, and half-and-half. Whole or full-fat cream cheese. Whole-fat or sweetened yogurt. Full-fat cheese. Nondairy creamers. Whipped toppings.  Processed cheese and cheese spreads. Fats and oils Butter. Stick margarine. Lard. Shortening. Ghee. Bacon fat. Tropical oils, such as coconut, palm kernel, or palm oil. Seasoning and other foods Salted popcorn and pretzels. Onion salt, garlic salt, seasoned salt, table salt, and sea salt. Worcestershire sauce. Tartar sauce. Barbecue sauce. Teriyaki sauce. Soy sauce, including reduced-sodium. Steak sauce. Canned and packaged gravies. Fish sauce. Oyster sauce. Cocktail sauce. Horseradish that you find on the shelf. Ketchup. Mustard. Meat flavorings and tenderizers. Bouillon cubes. Hot sauce and Tabasco sauce. Premade or packaged marinades. Premade or packaged taco seasonings. Relishes. Regular salad dressings. Where to find more information:  National Heart, Lung, and Blood Institute: www.nhlbi.nih.gov  American Heart Association: www.heart.org Summary  The DASH eating plan is a healthy eating plan that has been shown to reduce high blood pressure (hypertension). It may also reduce your risk for type 2 diabetes, heart disease, and stroke.  With the DASH eating plan, you should limit salt (sodium) intake to 2,300 mg a day. If you have hypertension, you may need to reduce your sodium intake to 1,500 mg a day.  When on the DASH eating plan, aim to eat more fresh fruits and vegetables, whole grains, lean proteins, low-fat dairy, and heart-healthy fats.  Work with your health care provider or diet and nutrition specialist (dietitian) to adjust your eating plan to your   individual calorie needs. This information is not intended to replace advice given to you by your health care provider. Make sure you discuss any questions you have with your health care provider. Document Revised: 01/31/2017 Document Reviewed: 02/12/2016 Elsevier Patient Education  2020 Elsevier Inc.  

## 2019-12-03 ENCOUNTER — Other Ambulatory Visit: Payer: Self-pay | Admitting: Internal Medicine

## 2019-12-03 ENCOUNTER — Ambulatory Visit: Payer: 59 | Attending: Internal Medicine

## 2019-12-03 DIAGNOSIS — Z23 Encounter for immunization: Secondary | ICD-10-CM

## 2019-12-03 NOTE — Progress Notes (Signed)
   Covid-19 Vaccination Clinic  Name:  James Ochoa    MRN: [id number] DOB: [birth year]  12/03/2019  [name] was observed post Covid-19 immunization for 15 minutes without incident. He was provided with Vaccine Information Sheet and instruction to access the V-Safe system.   [name] was instructed to call 911 with any severe reactions post vaccine: Marland Kitchen Difficulty breathing  . Swelling of face and throat  . A fast heartbeat  . A bad rash all over body  . Dizziness and weakness   Immunizations Administered    Name Date Dose VIS Date Route   Moderna COVID-19 Vaccine 12/03/2019 10:49 AM 0.5 mL 02/2019 Intramuscular   Manufacturer: Moderna   Lot: [id number]   NDC: [id number]

## 2020-05-26 ENCOUNTER — Other Ambulatory Visit (HOSPITAL_COMMUNITY)
Admission: RE | Admit: 2020-05-26 | Discharge: 2020-05-26 | Disposition: A | Discharge status: Home / Self Care | Payer: 59 | Source: Other Acute Inpatient Hospital | Attending: Family Medicine | Admitting: Family Medicine

## 2020-05-26 DIAGNOSIS — Z125 Encounter for screening for malignant neoplasm of prostate: Secondary | ICD-10-CM | POA: Insufficient documentation

## 2020-05-26 LAB — BASIC METABOLIC PANEL
Anion gap: 9 (ref 5–15)
BUN: 11 mg/dL (ref 8–23)
CO2: 26 mmol/L (ref 22–32)
Calcium: 9.7 mg/dL (ref 8.9–10.3)
Chloride: 100 mmol/L (ref 98–111)
Creatinine, Ser: 0.95 mg/dL (ref 0.61–1.24)
GFR, Estimated: >60 mL/min (ref >60)
Glucose, Bld: 89 mg/dL (ref 70–99)
Potassium: 4 mmol/L (ref 3.5–5.1)
Sodium: 135 mmol/L (ref 135–145)

## 2020-05-26 LAB — LIPID PANEL
Cholesterol: 224 mg/dL — ABNORMAL HIGH (ref 0–200)
HDL: 48 mg/dL (ref >40)
LDL Cholesterol: 153 mg/dL — ABNORMAL HIGH (ref 0–99)
Total CHOL/HDL Ratio: 4.7 RATIO
Triglycerides: 113 mg/dL (ref <150)
VLDL: 23 mg/dL (ref 0–40)

## 2020-05-26 LAB — PSA: Prostatic Specific Antigen: 0.38 ng/mL (ref 0.00–4.00)

## 2020-05-26 LAB — HEPATIC FUNCTION PANEL
ALT: 42 U/L (ref 0–44)
AST: 35 U/L (ref 15–41)
Albumin: 4.8 g/dL (ref 3.5–5.0)
Alkaline Phosphatase: 48 U/L (ref 38–126)
Bilirubin, Direct: 0.1 mg/dL (ref 0.0–0.2)
Indirect Bilirubin: 0.8 mg/dL (ref 0.3–0.9)
Total Bilirubin: 0.9 mg/dL (ref 0.3–1.2)
Total Protein: 8.2 g/dL — ABNORMAL HIGH (ref 6.5–8.1)

## 2020-05-29 ENCOUNTER — Other Ambulatory Visit: Payer: Self-pay

## 2020-05-29 ENCOUNTER — Other Ambulatory Visit: Payer: Self-pay | Admitting: Family Medicine

## 2020-05-29 ENCOUNTER — Telehealth (INDEPENDENT_AMBULATORY_CARE_PROVIDER_SITE_OTHER): Payer: 59 | Admitting: Family Medicine

## 2020-05-29 ENCOUNTER — Telehealth: Payer: Self-pay | Admitting: *Deleted

## 2020-05-29 DIAGNOSIS — Z1211 Encounter for screening for malignant neoplasm of colon: Secondary | ICD-10-CM

## 2020-05-29 DIAGNOSIS — E782 Mixed hyperlipidemia: Secondary | ICD-10-CM

## 2020-05-29 DIAGNOSIS — I10 Essential (primary) hypertension: Secondary | ICD-10-CM

## 2020-05-29 MED ORDER — PRAVASTATIN SODIUM 20 MG PO TABS
20.0000 mg | ORAL_TABLET | Freq: Every day | ORAL | 1 refills | Status: DC
Start: 2020-05-29 — End: 2020-05-29

## 2020-05-29 MED ORDER — LISINOPRIL 20 MG PO TABS
20.0000 mg | ORAL_TABLET | Freq: Every day | ORAL | 1 refills | Status: DC
Start: 2020-05-29 — End: 2020-05-29

## 2020-05-29 NOTE — Telephone Encounter (Signed)
Mr. ithiel, liebler are scheduled for a virtual visit with your provider today.    Just as we do with appointments in the office, we must obtain your consent to participate.  Your consent will be active for this visit and any virtual visit you may have with one of our providers in the next 365 days.    If you have a MyChart account, I can also send a copy of this consent to you electronically.  All virtual visits are billed to your insurance company just like a traditional visit in the office.  As this is a virtual visit, video technology does not allow for your provider to perform a traditional examination.  This may limit your provider's ability to fully assess your condition.  If your provider identifies any concerns that need to be evaluated in person or the need to arrange testing such as labs, EKG, etc, we will make arrangements to do so.    Although advances in technology are sophisticated, we cannot ensure that it will always work on either your end or our end.  If the connection with a video visit is poor, we may have to switch to a telephone visit.  With either a video or telephone visit, we are not always able to ensure that we have a secure connection.   I need to obtain your verbal consent now.   Are you willing to proceed with your visit today?   James Ochoa has provided verbal consent on 05/29/2020 for a virtual visit (video or telephone).

## 2020-05-29 NOTE — Progress Notes (Signed)
   Subjective:    Patient ID: James Ochoa, male    DOB: 1960/06/20, 60 y.o.   MRN: [id number]  Hypertension This is a chronic problem. The current episode started more than 1 year ago. Risk factors for coronary artery disease include dyslipidemia. Treatments tried: lisinopril. There are no compliance problems.   Hyperlipidemia does not take his medications consistently states he is can work much harder on taking his medicine watching his diet Also he has not done: Screening we talked at length about the importance of this We will go ahead with Cologuard after discussion with the patient   Review of Systems Virtual Visit via Telephone Note  I connected with James Ochoa on 05/29/20 at  1:30 PM EDT by telephone and verified that I am speaking with the correct person using two identifiers.  Location: Patient: home Provider: office   I discussed the limitations, risks, security and privacy concerns of performing an evaluation and management service by telephone and the availability of in person appointments. I also discussed with the patient that there may be a patient responsible charge related to this service. The patient expressed understanding and agreed to proceed.   History of Present Illness:    Observations/Objective:   Assessment and Plan:   Follow Up Instructions:    I discussed the assessment and treatment plan with the patient. The patient was provided an opportunity to ask questions and all were answered. The patient agreed with the plan and demonstrated an understanding of the instructions.   The patient was advised to call back or seek an in-person evaluation if the symptoms worsen or if the condition fails to improve as anticipated.  I provided 20 minutes of non-face-to-face time during this encounter.        Objective:   Physical Exam Today's visit was via telephone Physical exam was not possible for this visit        Assessment & Plan:  1. Mixed  hyperlipidemia He will be more consistent with taking his medications and will check labs before the next visit follow-up later in the summer for a wellness checkup  2. Primary hypertension Reports that his blood pressure has been good continue exercising watching diet taking medication  3. Encounter for screening colonoscopy Patient requests Cologuard this was ordered - Cologuard

## 2020-08-06 ENCOUNTER — Encounter: Payer: Self-pay | Admitting: Family Medicine

## 2020-08-06 NOTE — Progress Notes (Signed)
Please mail a paper copy to the patient thank you

## 2020-11-22 ENCOUNTER — Other Ambulatory Visit: Payer: Self-pay

## 2020-11-22 MED FILL — Lisinopril Tab 20 MG: ORAL | 90 days supply | Qty: 90 | Fill #0 | Status: AC

## 2021-03-16 ENCOUNTER — Other Ambulatory Visit: Payer: Self-pay

## 2021-03-16 ENCOUNTER — Other Ambulatory Visit: Payer: Self-pay | Admitting: Family Medicine

## 2021-03-16 MED ORDER — LISINOPRIL 20 MG PO TABS
ORAL_TABLET | Freq: Every day | ORAL | 0 refills | Status: DC
  Filled 2021-03-16: qty 90, 90d supply, fill #0

## 2021-05-30 ENCOUNTER — Telehealth: Payer: Self-pay | Admitting: Family Medicine

## 2021-05-30 NOTE — Telephone Encounter (Signed)
FYI: Radiographer, therapeutic sent fax stating that Cologuard order has expired.  ?

## 2021-05-31 ENCOUNTER — Encounter: Payer: Self-pay | Admitting: *Deleted

## 2021-05-31 DIAGNOSIS — Z1211 Encounter for screening for malignant neoplasm of colon: Secondary | ICD-10-CM

## 2021-05-31 NOTE — Telephone Encounter (Signed)
MyChart message sent to patient.

## 2021-05-31 NOTE — Telephone Encounter (Signed)
Nurses-please send patient a MyChart message alerting him that he has not done the Cologuard and asking him if he is still interested in doing so to let us know and we can reorder it thank you ?

## 2021-06-12 LAB — COLOGUARD

## 2021-11-01 ENCOUNTER — Other Ambulatory Visit: Payer: Self-pay | Admitting: Family Medicine

## 2021-11-01 ENCOUNTER — Other Ambulatory Visit: Payer: Self-pay

## 2021-11-01 NOTE — Telephone Encounter (Signed)
Please contact patient to have him schedule office visit. Then may route back to nurses. Thank you

## 2021-11-02 ENCOUNTER — Other Ambulatory Visit: Payer: Self-pay | Admitting: Family Medicine

## 2021-11-02 ENCOUNTER — Other Ambulatory Visit: Payer: Self-pay

## 2021-11-02 MED FILL — Lisinopril Tab 20 MG: ORAL | 45 days supply | Qty: 45 | Fill #0 | Status: AC

## 2021-12-10 ENCOUNTER — Other Ambulatory Visit: Payer: Self-pay

## 2021-12-10 ENCOUNTER — Other Ambulatory Visit: Payer: Self-pay | Admitting: Family Medicine

## 2021-12-10 MED ORDER — PRAVASTATIN SODIUM 20 MG PO TABS
ORAL_TABLET | Freq: Every day | ORAL | 0 refills | Status: DC
  Filled 2021-12-10: qty 30, 30d supply, fill #0

## 2021-12-10 MED ORDER — LISINOPRIL 20 MG PO TABS
ORAL_TABLET | Freq: Every day | ORAL | 0 refills | Status: DC
  Filled 2021-12-10: qty 30, 30d supply, fill #0

## 2022-01-09 ENCOUNTER — Other Ambulatory Visit: Payer: Self-pay | Admitting: Family Medicine

## 2022-01-10 ENCOUNTER — Other Ambulatory Visit: Payer: Self-pay

## 2022-01-10 MED ORDER — PRAVASTATIN SODIUM 20 MG PO TABS
ORAL_TABLET | Freq: Every day | ORAL | 0 refills | Status: DC
  Filled 2022-01-10: qty 30, 30d supply, fill #0

## 2022-01-10 MED ORDER — LISINOPRIL 20 MG PO TABS
ORAL_TABLET | Freq: Every day | ORAL | 0 refills | Status: DC
Start: 2022-01-10 — End: 2022-02-08
  Filled 2022-01-10: qty 30, 30d supply, fill #0

## 2022-02-08 ENCOUNTER — Other Ambulatory Visit: Payer: Self-pay | Admitting: Family Medicine

## 2022-02-08 ENCOUNTER — Other Ambulatory Visit: Payer: Self-pay

## 2022-02-08 MED ORDER — LISINOPRIL 20 MG PO TABS
20.0000 mg | ORAL_TABLET | Freq: Every day | ORAL | 0 refills | Status: DC
  Filled 2022-02-08: qty 30, 30d supply, fill #0

## 2022-02-19 ENCOUNTER — Other Ambulatory Visit: Payer: Self-pay

## 2022-02-20 DIAGNOSIS — X32XXXA Exposure to sunlight, initial encounter: Secondary | ICD-10-CM | POA: Diagnosis not present

## 2022-02-20 DIAGNOSIS — L570 Actinic keratosis: Secondary | ICD-10-CM | POA: Diagnosis not present

## 2022-03-01 ENCOUNTER — Telehealth: Payer: Self-pay | Admitting: *Deleted

## 2022-03-01 ENCOUNTER — Other Ambulatory Visit: Payer: Self-pay

## 2022-03-01 ENCOUNTER — Telehealth: Payer: Self-pay | Admitting: Family Medicine

## 2022-03-01 ENCOUNTER — Other Ambulatory Visit: Payer: Self-pay | Admitting: Family Medicine

## 2022-03-01 MED ORDER — MOLNUPIRAVIR EUA 200MG CAPSULE
4.0000 | ORAL_CAPSULE | Freq: Two times a day (BID) | ORAL | 0 refills | Status: AC
  Filled 2022-03-01: qty 40, 5d supply, fill #0

## 2022-03-01 NOTE — Telephone Encounter (Signed)
Patient tested positive for Covid this morning and [employer] has taken him out of work until 03/06/2021. He has fever,cough,headache,weakness. [facility]

## 2022-03-01 NOTE — Telephone Encounter (Signed)
Patient left message on machine stating that he is an employee of [employer] and just tested positive for Covid and is requesting antiviral called into [facility]

## 2022-03-01 NOTE — Telephone Encounter (Signed)
Cook, Avard G, DO     No recent renal function available.  Antiviral sent in.

## 2022-03-01 NOTE — Telephone Encounter (Signed)
Patient notified and discussed warning signs. Patient verbalized understanding.

## 2022-03-01 NOTE — Telephone Encounter (Signed)
See other message duplicate

## 2022-03-09 ENCOUNTER — Other Ambulatory Visit: Payer: Self-pay | Admitting: Family Medicine

## 2022-03-11 ENCOUNTER — Other Ambulatory Visit: Payer: Self-pay

## 2022-03-11 MED ORDER — LISINOPRIL 20 MG PO TABS
20.0000 mg | ORAL_TABLET | Freq: Every day | ORAL | 0 refills | Status: DC
Start: 2022-03-11 — End: 2022-04-17
  Filled 2022-03-11: qty 30, 30d supply, fill #0

## 2022-03-11 NOTE — Telephone Encounter (Signed)
I would be fine with giving 30 days  Truly he needs an office visit please relay this to him

## 2022-03-20 ENCOUNTER — Other Ambulatory Visit: Payer: Self-pay

## 2022-04-12 ENCOUNTER — Other Ambulatory Visit: Payer: Self-pay | Admitting: Family Medicine

## 2022-04-15 NOTE — Telephone Encounter (Signed)
Sent my chart message to schedule appointment 04/15/22.

## 2022-04-16 ENCOUNTER — Other Ambulatory Visit: Payer: Self-pay

## 2022-04-16 ENCOUNTER — Other Ambulatory Visit: Payer: Self-pay | Admitting: Family Medicine

## 2022-04-16 NOTE — Telephone Encounter (Signed)
May have an additional 30-day with 2 refill to last him through his follow-up appointment with me

## 2022-04-17 ENCOUNTER — Other Ambulatory Visit: Payer: Self-pay

## 2022-04-17 MED FILL — Lisinopril Tab 20 MG: ORAL | 30 days supply | Qty: 30 | Fill #0 | Status: AC

## 2022-04-25 ENCOUNTER — Other Ambulatory Visit: Payer: Self-pay

## 2022-05-14 ENCOUNTER — Other Ambulatory Visit: Payer: Self-pay

## 2022-05-14 MED FILL — Lisinopril Tab 20 MG: ORAL | 30 days supply | Qty: 30 | Fill #1 | Status: AC

## 2022-06-03 ENCOUNTER — Other Ambulatory Visit: Payer: Self-pay

## 2022-06-03 ENCOUNTER — Ambulatory Visit: Payer: Commercial Managed Care - PPO | Admitting: Family Medicine

## 2022-06-03 VITALS — BP 122/86 | HR 98 | Ht 73.5 in | Wt 207.0 lb

## 2022-06-03 DIAGNOSIS — I10 Essential (primary) hypertension: Secondary | ICD-10-CM | POA: Diagnosis not present

## 2022-06-03 DIAGNOSIS — Z125 Encounter for screening for malignant neoplasm of prostate: Secondary | ICD-10-CM | POA: Diagnosis not present

## 2022-06-03 DIAGNOSIS — E782 Mixed hyperlipidemia: Secondary | ICD-10-CM | POA: Diagnosis not present

## 2022-06-03 DIAGNOSIS — Z1211 Encounter for screening for malignant neoplasm of colon: Secondary | ICD-10-CM | POA: Diagnosis not present

## 2022-06-03 MED ORDER — LISINOPRIL 20 MG PO TABS
20.0000 mg | ORAL_TABLET | Freq: Every day | ORAL | 1 refills | Status: DC
  Filled 2022-06-03 – 2022-06-21 (×2): qty 90, 90d supply, fill #0
  Filled 2022-09-12 (×2): qty 90, 90d supply, fill #1

## 2022-06-03 MED ORDER — PRAVASTATIN SODIUM 20 MG PO TABS
20.0000 mg | ORAL_TABLET | Freq: Every day | ORAL | 1 refills | Status: DC
  Filled 2022-06-03: qty 90, 90d supply, fill #0

## 2022-06-03 NOTE — Progress Notes (Signed)
   It is think it  Subjective:    Patient ID: James Ochoa, male    DOB: December 27, 1960, 62 y.o.   MRN: [id number]  HPI Patient arrives today for medication follow up. Patient states no concerns or issues today.  Patient for blood pressure check up.  The patient does have hypertension.   Patient relates dietary measures try to minimize salt The importance of healthy diet and activity were discussed Patient relates compliance  Patient here for follow-up regarding cholesterol.    Patient relates taking medication on a regular basis Denies problems with medication Importance of dietary measures discussed Regular lab work regarding lipid and liver was checked and if needing additional labs was appropriately ordered    Review of Systems     Objective:   Physical Exam General-in no acute distress Eyes-no discharge Lungs-respiratory rate normal, CTA CV-no murmurs,RRR Extremities skin warm dry no edema Neuro grossly normal Behavior normal, alert        Assessment & Plan:  1. Primary hypertension HTN- patient seen for follow-up regarding HTN.   Diet, medication compliance, appropriate labs and refills were completed.   Importance of keeping blood pressure under good control to lessen the risk of complications discussed Regular follow-up visits discussed  - Basic Metabolic Panel - Hepatic Function Panel - Microalbumin/Creatinine Ratio, Urine  2. Mixed hyperlipidemia Hyperlipidemia-importance of diet, weight control, activity, compliance with medications discussed.   Recent labs reviewed.   Any additional labs or refills ordered.   Importance of keeping under good control discussed. Regular follow-up visits discussed  - Lipid panel  3. Screening for colon cancer Regards to this patient would prefer Cologuard to keep was ordered this once before but did not follow through with getting it completed this time he stated he will - Cologuard  4. Screening PSA (prostate  specific antigen) Screening PSA - PSA Follow-up visit within 6 months  Patient doing a good job exercising and healthy eating

## 2022-06-11 ENCOUNTER — Other Ambulatory Visit: Payer: Self-pay

## 2022-06-21 ENCOUNTER — Other Ambulatory Visit: Payer: Self-pay

## 2022-09-10 ENCOUNTER — Ambulatory Visit: Payer: Commercial Managed Care - PPO | Admitting: Family Medicine

## 2022-09-12 ENCOUNTER — Other Ambulatory Visit: Payer: Self-pay

## 2022-09-12 ENCOUNTER — Telehealth: Payer: Commercial Managed Care - PPO | Admitting: Physician Assistant

## 2022-09-12 DIAGNOSIS — J069 Acute upper respiratory infection, unspecified: Secondary | ICD-10-CM

## 2022-09-12 DIAGNOSIS — B9689 Other specified bacterial agents as the cause of diseases classified elsewhere: Secondary | ICD-10-CM

## 2022-09-12 MED ORDER — AMOXICILLIN-POT CLAVULANATE 875-125 MG PO TABS
1.0000 | ORAL_TABLET | Freq: Two times a day (BID) | ORAL | 0 refills | Status: DC
  Filled 2022-09-12: qty 20, 10d supply, fill #0

## 2022-09-12 MED ORDER — BENZONATATE 100 MG PO CAPS
100.0000 mg | ORAL_CAPSULE | Freq: Three times a day (TID) | ORAL | 0 refills | Status: DC | PRN
  Filled 2022-09-12: qty 30, 10d supply, fill #0

## 2022-09-12 NOTE — Progress Notes (Signed)
E-Visit for Sinus Problems and Cough  We are sorry that you are not feeling well.  Here is how we plan to help!  Based on what you have shared with me it looks like you have sinusitis.  Sinusitis is inflammation and infection in the sinus cavities of the head.  Based on your presentation I believe you most likely have Acute Bacterial Sinusitis.  This is an infection caused by bacteria and is treated with antibiotics. I have prescribed  Augmentin 875mg /125mg  one tablet twice daily with food, for 10 days.I have also prescribed Tessalon (benzonatate) perles for your cough. You may use an oral decongestant such as Mucinex D or if you have glaucoma or high blood pressure use plain Mucinex. Saline nasal spray help and can safely be used as often as needed for congestion.  If you develop worsening sinus pain, fever or notice severe headache and vision changes, or if symptoms are not better after completion of antibiotic, please schedule an appointment with a health care provider.    Sinus infections are not as easily transmitted as other respiratory infection, however we still recommend that you avoid close contact with loved ones, especially the very young and elderly.  Remember to wash your hands thoroughly throughout the day as this is the number one way to prevent the spread of infection!  Home Care: Only take medications as instructed by your medical team. Complete the entire course of an antibiotic. Do not take these medications with alcohol. A steam or ultrasonic humidifier can help congestion.  You can place a towel over your head and breathe in the steam from hot water coming from a faucet. Avoid close contacts especially the very young and the elderly. Cover your mouth when you cough or sneeze. Always remember to wash your hands.  Get Help Right Away If: You develop worsening fever or sinus pain. You develop a severe head ache or visual changes. Your symptoms persist after you have completed  your treatment plan.  Make sure you Understand these instructions. Will watch your condition. Will get help right away if you are not doing well or get worse.  Thank you for choosing an e-visit.  Your e-visit answers were reviewed by a board certified advanced clinical practitioner to complete your personal care plan. Depending upon the condition, your plan could have included both over the counter or prescription medications.  Please review your pharmacy choice. Make sure the pharmacy is open so you can pick up prescription now. If there is a problem, you may contact your provider through [employer] and have the prescription routed to another pharmacy.  Your safety is important to [place]. If you have drug allergies check your prescription carefully.   For the next 24 hours you can use MyChart to ask questions about today's visit, request a non-urgent call back, or ask for a work or school excuse. You will get an email in the next two days asking about your experience. I hope that your e-visit has been valuable and will speed your recovery.  I have spent 5 minutes in review of e-visit questionnaire, review and updating patient chart, medical decision making and response to patient.   Margaretann Loveless, PA-C

## 2022-11-21 ENCOUNTER — Other Ambulatory Visit (HOSPITAL_COMMUNITY)
Admission: RE | Admit: 2022-11-21 | Discharge: 2022-11-21 | Disposition: A | Discharge status: Home / Self Care | Payer: Commercial Managed Care - PPO | Source: Ambulatory Visit | Attending: Family Medicine | Admitting: Family Medicine

## 2022-11-21 DIAGNOSIS — E782 Mixed hyperlipidemia: Secondary | ICD-10-CM | POA: Insufficient documentation

## 2022-11-21 DIAGNOSIS — I10 Essential (primary) hypertension: Secondary | ICD-10-CM | POA: Diagnosis not present

## 2022-11-21 DIAGNOSIS — Z125 Encounter for screening for malignant neoplasm of prostate: Secondary | ICD-10-CM | POA: Insufficient documentation

## 2022-11-21 LAB — HEPATIC FUNCTION PANEL
ALT: 33 U/L (ref 0–44)
AST: 26 U/L (ref 15–41)
Albumin: 4.6 g/dL (ref 3.5–5.0)
Alkaline Phosphatase: 50 U/L (ref 38–126)
Bilirubin, Direct: 0.1 mg/dL (ref 0.0–0.2)
Indirect Bilirubin: 1 mg/dL — ABNORMAL HIGH (ref 0.3–0.9)
Total Bilirubin: 1.1 mg/dL (ref 0.3–1.2)
Total Protein: 7.8 g/dL (ref 6.5–8.1)

## 2022-11-21 LAB — LIPID PANEL
Cholesterol: 236 mg/dL — ABNORMAL HIGH (ref 0–200)
HDL: 42 mg/dL (ref >40)
LDL Cholesterol: 184 mg/dL — ABNORMAL HIGH (ref 0–99)
Total CHOL/HDL Ratio: 5.6 RATIO
Triglycerides: 51 mg/dL (ref <150)
VLDL: 10 mg/dL (ref 0–40)

## 2022-11-21 LAB — BASIC METABOLIC PANEL
Anion gap: 7 (ref 5–15)
BUN: 13 mg/dL (ref 8–23)
CO2: 26 mmol/L (ref 22–32)
Calcium: 9.5 mg/dL (ref 8.9–10.3)
Chloride: 101 mmol/L (ref 98–111)
Creatinine, Ser: 0.83 mg/dL (ref 0.61–1.24)
GFR, Estimated: >60 mL/min (ref >60)
Glucose, Bld: 96 mg/dL (ref 70–99)
Potassium: 4.1 mmol/L (ref 3.5–5.1)
Sodium: 134 mmol/L — ABNORMAL LOW (ref 135–145)

## 2022-11-21 LAB — PSA: Prostatic Specific Antigen: 0.5 ng/mL (ref 0.00–4.00)

## 2022-11-22 DIAGNOSIS — Z1211 Encounter for screening for malignant neoplasm of colon: Secondary | ICD-10-CM | POA: Diagnosis not present

## 2022-11-22 LAB — MICROALBUMIN / CREATININE URINE RATIO
Creatinine, Urine: 35.4 mg/dL
Microalb Creat Ratio: <8 mg/g creat (ref 0–29)
Microalb, Ur: <3 ug/mL — ABNORMAL HIGH

## 2022-11-25 ENCOUNTER — Other Ambulatory Visit: Payer: Self-pay

## 2022-11-25 ENCOUNTER — Ambulatory Visit: Payer: Commercial Managed Care - PPO | Admitting: Family Medicine

## 2022-11-25 VITALS — BP 128/82 | HR 61 | Temp 98.0°F | Ht 73.5 in | Wt 204.0 lb

## 2022-11-25 DIAGNOSIS — E782 Mixed hyperlipidemia: Secondary | ICD-10-CM | POA: Diagnosis not present

## 2022-11-25 DIAGNOSIS — I10 Essential (primary) hypertension: Secondary | ICD-10-CM | POA: Diagnosis not present

## 2022-11-25 MED ORDER — LISINOPRIL 20 MG PO TABS
20.0000 mg | ORAL_TABLET | Freq: Every day | ORAL | 1 refills | Status: DC
  Filled 2022-11-25 – 2022-12-06 (×2): qty 90, 90d supply, fill #0
  Filled 2023-03-01: qty 90, 90d supply, fill #1

## 2022-11-25 MED ORDER — PRAVASTATIN SODIUM 20 MG PO TABS
20.0000 mg | ORAL_TABLET | Freq: Every day | ORAL | 1 refills | Status: DC
  Filled 2022-11-25 – 2022-12-06 (×2): qty 90, 90d supply, fill #0
  Filled 2023-03-01: qty 90, 90d supply, fill #1

## 2022-11-25 NOTE — Progress Notes (Signed)
Subjective:    Patient ID: James Ochoa, male    DOB: 1960-12-01, 62 y.o.   MRN: [id number]  Hypertension This is a chronic problem. Treatments tried: lisinopril.  Hyperlipidemia  Current treatment Pravastatin   Patient for blood pressure check up.  The patient does have hypertension.   Patient relates dietary measures try to minimize salt The importance of healthy diet and activity were discussed Patient relates compliance  Patient here for follow-up regarding cholesterol.    Patient relates taking medication on a regular basis Denies problems with medication Importance of dietary measures discussed Regular lab work regarding lipid and liver was checked and if needing additional labs was appropriately ordered Patient relates that he has not been taking the cholesterol medicine on a regular basis  We did discuss his elevated LDL and increased risk of heart disease offered coronary calcium patient defers  Review of Systems     Objective:   Physical Exam  General-in no acute distress Eyes-no discharge Lungs-respiratory rate normal, CTA CV-no murmurs,RRR Extremities skin warm dry no edema Neuro grossly normal Behavior normal, alert       Assessment & Plan:  Coronary calcium offered patient defers  Labs reviewed in detail with the patient  PSA looks normal.  Colonoscopy recommended patient has Cologuard at home he will do this we await the results  HTN- patient seen for follow-up regarding HTN.   Diet, medication compliance, appropriate labs and refills were completed.   Importance of keeping blood pressure under good control to lessen the risk of complications discussed Regular follow-up visits discussed  Hyperlipidemia-importance of diet, weight control, activity, compliance with medications discussed.   Recent labs reviewed.   Any additional labs or refills ordered.   Importance of keeping under good control discussed. Regular follow-up visits discussed He  states he would do a better job on the cholesterol he will let us know in a few months when he is ready to repeat the cholesterol profile Follow-up 6 months

## 2022-11-27 LAB — COLOGUARD: COLOGUARD: NEGATIVE

## 2022-12-06 ENCOUNTER — Other Ambulatory Visit: Payer: Self-pay

## 2023-03-12 ENCOUNTER — Other Ambulatory Visit: Payer: Self-pay

## 2023-05-26 ENCOUNTER — Ambulatory Visit: Payer: Commercial Managed Care - PPO | Admitting: Family Medicine

## 2023-07-10 ENCOUNTER — Other Ambulatory Visit: Payer: Self-pay

## 2023-07-10 DIAGNOSIS — R040 Epistaxis: Secondary | ICD-10-CM | POA: Diagnosis not present

## 2023-07-10 DIAGNOSIS — J340 Abscess, furuncle and carbuncle of nose: Secondary | ICD-10-CM | POA: Diagnosis not present

## 2023-07-10 MED ORDER — GENTAMICIN SULFATE 0.1 % EX OINT
TOPICAL_OINTMENT | Freq: Three times a day (TID) | CUTANEOUS | 11 refills | Status: AC
  Filled 2023-07-10: qty 30, 10d supply, fill #0

## 2023-07-25 ENCOUNTER — Ambulatory Visit: Admitting: Family Medicine

## 2023-07-25 ENCOUNTER — Encounter: Payer: Self-pay | Admitting: Family Medicine

## 2023-07-25 ENCOUNTER — Other Ambulatory Visit: Payer: Self-pay

## 2023-07-25 VITALS — BP 118/82 | HR 65 | Temp 98.4°F | Ht 73.5 in | Wt 206.8 lb

## 2023-07-25 DIAGNOSIS — Z79899 Other long term (current) drug therapy: Secondary | ICD-10-CM

## 2023-07-25 DIAGNOSIS — J301 Allergic rhinitis due to pollen: Secondary | ICD-10-CM | POA: Diagnosis not present

## 2023-07-25 DIAGNOSIS — E782 Mixed hyperlipidemia: Secondary | ICD-10-CM | POA: Diagnosis not present

## 2023-07-25 DIAGNOSIS — I10 Essential (primary) hypertension: Secondary | ICD-10-CM

## 2023-07-25 MED ORDER — LISINOPRIL 20 MG PO TABS
20.0000 mg | ORAL_TABLET | Freq: Every day | ORAL | 2 refills | Status: DC
  Filled 2023-11-28: qty 90, 90d supply, fill #0

## 2023-07-25 MED ORDER — PRAVASTATIN SODIUM 20 MG PO TABS
20.0000 mg | ORAL_TABLET | Freq: Every day | ORAL | 2 refills | Status: DC
  Filled 2023-07-25: qty 90, 90d supply, fill #0

## 2023-07-25 MED ORDER — LISINOPRIL 20 MG PO TABS
20.0000 mg | ORAL_TABLET | Freq: Every day | ORAL | 1 refills | Status: DC
  Filled 2023-07-25: qty 90, 90d supply, fill #0

## 2023-07-25 MED ORDER — AZELASTINE HCL 0.1 % NA SOLN
2.0000 | Freq: Two times a day (BID) | NASAL | 12 refills | Status: AC
  Filled 2023-07-25: qty 30, 25d supply, fill #0

## 2023-07-25 NOTE — Progress Notes (Unsigned)
   Subjective:    Patient ID: James Ochoa, male    DOB: 19-Jul-1960, 63 y.o.   MRN: [id number]  HPI  Patient doing an excellent job of taking his medicines also doing great with exercise on a regular basis plus also staying healthy with his eating habits denies any chest tightness pressure pain shortness of breath Patient for blood pressure check up.  The patient does have hypertension.   Patient relates dietary measures try to minimize salt The importance of healthy diet and activity were discussed Patient relates compliance  Patient here for follow-up regarding cholesterol.    Patient relates taking medication on a regular basis Denies problems with medication Importance of dietary measures discussed Regular lab work regarding lipid and liver was checked and if needing additional labs was appropriately ordered  Review of Systems     Objective:   Physical Exam  General-in no acute distress Eyes-no discharge Lungs-respiratory rate normal, CTA CV-no murmurs,RRR Extremities skin warm dry no edema Neuro grossly normal Behavior normal, alert       Assessment & Plan:   1. Primary hypertension (Primary) Blood pressure good control healthy diet continue current meds - Basic Metabolic Panel  2. Mixed hyperlipidemia Continue meds check labs healthy diet - Lipid panel  3. Seasonal allergic rhinitis due to pollen Allergy medicines OTC as needed - Hepatic function panel  4. High risk medication use Check labs to make sure his body is getting along with the medicine - Lipid panel - Hepatic function panel

## 2023-07-29 ENCOUNTER — Other Ambulatory Visit: Payer: Self-pay

## 2023-10-05 ENCOUNTER — Telehealth: Admitting: Physician Assistant

## 2023-10-05 DIAGNOSIS — K047 Periapical abscess without sinus: Secondary | ICD-10-CM | POA: Diagnosis not present

## 2023-10-06 ENCOUNTER — Other Ambulatory Visit: Payer: Self-pay

## 2023-10-06 MED ORDER — AMOXICILLIN-POT CLAVULANATE 875-125 MG PO TABS
1.0000 | ORAL_TABLET | Freq: Two times a day (BID) | ORAL | 0 refills | Status: DC
  Filled 2023-10-06: qty 14, 7d supply, fill #0

## 2023-10-06 NOTE — Progress Notes (Signed)

## 2023-11-28 ENCOUNTER — Other Ambulatory Visit: Payer: Self-pay

## 2023-12-02 ENCOUNTER — Other Ambulatory Visit: Payer: Self-pay

## 2024-02-04 ENCOUNTER — Encounter: Payer: Self-pay | Admitting: Family Medicine

## 2024-02-04 ENCOUNTER — Ambulatory Visit: Admitting: Family Medicine

## 2024-02-04 VITALS — BP 126/77 | HR 57 | Temp 98.1°F | Ht 73.5 in | Wt 207.0 lb

## 2024-02-04 DIAGNOSIS — E782 Mixed hyperlipidemia: Secondary | ICD-10-CM | POA: Diagnosis not present

## 2024-02-04 DIAGNOSIS — Z0000 Encounter for general adult medical examination without abnormal findings: Secondary | ICD-10-CM

## 2024-02-04 DIAGNOSIS — I10 Essential (primary) hypertension: Secondary | ICD-10-CM

## 2024-02-04 DIAGNOSIS — Z125 Encounter for screening for malignant neoplasm of prostate: Secondary | ICD-10-CM | POA: Diagnosis not present

## 2024-02-04 DIAGNOSIS — Z79899 Other long term (current) drug therapy: Secondary | ICD-10-CM | POA: Diagnosis not present

## 2024-02-04 DIAGNOSIS — Z0001 Encounter for general adult medical examination with abnormal findings: Secondary | ICD-10-CM | POA: Diagnosis not present

## 2024-02-04 NOTE — Progress Notes (Unsigned)
   Subjective:    Patient ID: James Ochoa, male    DOB: [birth year], 63 y.o.   MRN: [id number]  HPI  The patient comes in today for a wellness visit. Patient overall doing well Takes his medicine Stays very active with exercise Eats well Stress levels are reasonable Has a busy job with the hospital lab    A review of their health history was completed.  A review of medications was also completed.  Any needed refills; no  Eating habits: good  Falls/  MVA accidents in past few months: no  Regular exercise: daily  Specialist pt sees on regular basis: no  Preventative health issues were discussed.   Additional concerns: none   Review of Systems     Objective:   Physical Exam  General-in no acute distress Eyes-no discharge Lungs-respiratory rate normal, CTA CV-no murmurs,RRR Extremities skin warm dry no edema Neuro grossly normal Behavior normal, alert       Assessment & Plan:  1. Well adult exam Adult wellness-complete.wellness physical was conducted today. Importance of diet and exercise were discussed in detail.  Importance of stress reduction and healthy living were discussed.  In addition to this a discussion regarding safety was also covered.  We also reviewed over immunizations and gave recommendations regarding current immunization needed for age.   In addition to this additional areas were also touched on including: Preventative health exams needed:  Colonoscopy does Cologuard and next one will be 2027  Patient was advised yearly wellness exam   2. Mixed hyperlipidemia (Primary) Check lab work await results, continue pravastatin  20 mg daily - Lipid Profile  3. Primary hypertension Continue current measures.  Check labs, continue lisinopril  20 mg daily - Basic Metabolic Panel (BMET) - Urine Microalbumin w/creat. ratio  4. Screening PSA (prostate specific antigen) Screening ordered  5. High risk medication use Ordered - Hepatic function  panel  6. Prostate cancer screening Ordered - PSA If all goes well follow-up in 1 year follow-up sooner if any problems

## 2024-02-06 ENCOUNTER — Other Ambulatory Visit: Payer: Self-pay

## 2024-02-06 MED ORDER — PREVNAR 20 0.5 ML IM SUSY
0.5000 mL | PREFILLED_SYRINGE | Freq: Once | INTRAMUSCULAR | 0 refills | Status: AC
  Filled 2024-02-06: qty 0.5, 1d supply, fill #0

## 2024-02-17 ENCOUNTER — Other Ambulatory Visit: Payer: Self-pay | Admitting: Family Medicine

## 2024-02-17 ENCOUNTER — Other Ambulatory Visit: Payer: Self-pay

## 2024-02-17 MED ORDER — LISINOPRIL 20 MG PO TABS
20.0000 mg | ORAL_TABLET | Freq: Every day | ORAL | 2 refills | Status: AC
  Filled 2024-02-17: qty 90, 90d supply, fill #0

## 2024-02-17 MED ORDER — PRAVASTATIN SODIUM 20 MG PO TABS
20.0000 mg | ORAL_TABLET | Freq: Every day | ORAL | 2 refills | Status: AC
  Filled 2024-02-17: qty 90, 90d supply, fill #0

## 2024-02-19 ENCOUNTER — Other Ambulatory Visit: Payer: Self-pay

## 2024-02-20 ENCOUNTER — Ambulatory Visit: Payer: Self-pay

## 2024-02-20 ENCOUNTER — Other Ambulatory Visit: Payer: Self-pay

## 2024-02-20 ENCOUNTER — Telehealth: Admitting: Physician Assistant

## 2024-02-20 DIAGNOSIS — J069 Acute upper respiratory infection, unspecified: Secondary | ICD-10-CM

## 2024-02-20 DIAGNOSIS — J111 Influenza due to unidentified influenza virus with other respiratory manifestations: Secondary | ICD-10-CM

## 2024-02-20 NOTE — Telephone Encounter (Signed)
 FYI Only or Action Required?: FYI only for provider: appointment scheduled on UC .  Patient was last seen in primary care on 02/04/2024 by Alphonsa Glendia LABOR, MD.  Called Nurse Triage reporting Fever and Sore Throat.  Symptoms began yesterday.  Interventions attempted: OTC medications: ibuprofen.  Symptoms are: gradually worsening.  Triage Disposition: See HCP Within 4 Hours (Or PCP Triage)  Patient/caregiver understands and will follow disposition?: Yes  Copied from CRM #[id number]. Topic: Clinical - Red Word Triage >> Feb 20, 2024  3:13 PM James Ochoa wrote: Red Word that prompted transfer to Nurse Triage:  patient called said he started having a headache, sore throat and fever. 101.2 Reason for Disposition  [1] Fever > 101 F (38.3 C) AND [2] age > 60 years  Answer Assessment - Initial Assessment Questions Patient with symptoms starting last night- fever, sore throat, headache, body aches, cough and fatigue. Took a Flu and COVID test and both were negative- concern for it being to early. Denies CP, SOB, Dizziness, nausea, or vomiting. Works in the lab at [employer]. Concerned for delayed reaction to flu and pneumonia vaccine- it has been over 2 weeks so less likely.  Ibuprofen- not helping fever but helping headache  He usually goes running, symptoms are keeping him from going. Concerned that he will decline fast.  Advised UC for evaluation of symptoms. Discussed wait times. Will go to UC in AM but will go to ED for any acute worsening overnight  1. ONSET: When did the nasal discharge start?      Last night 2. AMOUNT: How much discharge is there?      Some runny nose 3. COUGH: Do you have a cough? If Yes, ask: Describe the color of your mucus. (e.g., clear, white, yellow, green)     Hacky cough 4. RESPIRATORY DISTRESS: Describe your breathing.      Denies SOB 5. FEVER: Do you have a fever? If Yes, ask: What is your temperature, how was it measured, and when did it start?      101.2 6. SEVERITY: Overall, how bad are you feeling right now? (e.g., doesn't interfere with normal activities, staying home from school/work, staying in bed)      Usually runs but he is so fatigued that he is not wanting to  7. OTHER SYMPTOMS: Do you have any other symptoms? (e.g., earache, mouth sores, sore throat, wheezing)     Headaches, fever, joint pain  Protocols used: Common Cold-A-AH

## 2024-02-21 ENCOUNTER — Other Ambulatory Visit: Payer: Self-pay

## 2024-02-21 MED ORDER — BENZONATATE 100 MG PO CAPS
100.0000 mg | ORAL_CAPSULE | Freq: Three times a day (TID) | ORAL | 0 refills | Status: AC | PRN
  Filled 2024-02-21: qty 21, 7d supply, fill #0

## 2024-02-21 NOTE — Progress Notes (Signed)
 E visit for Flu like symptoms   We are sorry that you are not feeling well.  Here is how we plan to help! Based on what you have shared with me it looks like you may have flu-like symptoms that should be watched but do not seem to indicate anti-viral treatment.  Influenza or the flu is  an infection caused by a respiratory virus. The flu virus is highly contagious and persons who did not receive their yearly flu vaccination may catch the flu from close contact.  We have anti-viral medications to treat the viruses that cause this infection. They are not a cure and only shorten the course of the infection. These prescriptions are most effective when they are given within the first 2 days of flu symptoms. Antiviral medications are indicated if you have a high risk of complications from the flu. You should  also consider an antiviral medication if you are in close contact with someone who is at risk. These medications can help patients avoid complications from the flu but have side effects that you should know.   Possible side effects from Tamiflu  or oseltamivir  include nausea, vomiting, diarrhea, dizziness, headaches, eye redness, sleep problems or other respiratory symptoms. You should not take Tamiflu  if you have an allergy to oseltamivir  or any to the ingredients in Tamiflu .  For nasal congestion, you may use an oral decongestant such as Mucinex D or if you have glaucoma or high blood pressure use plain Mucinex.  Saline nasal spray or nasal drops can help and can safely be used as often as needed for congestion.  If you have a sore or scratchy throat, use a saltwater gargle-  to  teaspoon of salt dissolved in a 4-ounce to 8-ounce glass of warm water.  Gargle the solution for approximately 15-30 seconds and then spit.  It is important not to swallow the solution.  You can also use throat lozenges/cough drops and Chloraseptic spray to help with throat pain or discomfort.  Warm or cold liquids  can also be helpful in relieving throat pain.  For headache, pain or general discomfort, you can use Ibuprofen or Tylenol as directed.   Some authorities believe that zinc sprays or the use of Echinacea may shorten the course of your symptoms.  I have prescribed the following medications to help lessen symptoms: I have prescribed Tessalon  Perles 100 mg. You may take 1-2 capsules every 8 hours as needed for cough  You are to isolate at home until you have been fever-free for at least 24 hours without a fever-reducing medication, and symptoms have been steadily improving for 24 hours.  If you must be around other household members who do not have symptoms, you need to make sure that both you and the family members are masking consistently with a high-quality mask.  If you note any worsening of symptoms despite treatment, please seek an in-person evaluation ASAP. If you note any significant shortness of breath or any chest pain, please seek ED evaluation. Please do not delay care!  ANYONE WHO HAS FLU SYMPTOMS SHOULD: Stay home. The flu is highly contagious and going out or to work exposes others! Be sure to drink plenty of fluids. Water is fine as well as fruit juices, sodas and electrolyte beverages. You may want to stay away from caffeine or alcohol. If you are nauseated, try taking small sips of liquids. How do you know if you are getting enough fluid? Your urine should be a pale yellow or almost  colorless. Get rest. Taking a steamy shower or using a humidifier may help nasal congestion and ease sore throat pain. Using a saline nasal spray works much the same way. Cough drops, hard candies and sore throat lozenges may ease your cough. Line up a caregiver. Have someone check on you regularly.  GET HELP RIGHT AWAY IF: You cannot keep down liquids or your medications. You become short of breath Your fell like you are going to pass out or loose consciousness. Your symptoms persist after you have  completed your treatment plan  MAKE SURE YOU  Understand these instructions. Will watch your condition. Will get help right away if you are not doing well or get worse.  Your e-visit answers were reviewed by a board certified advanced clinical practitioner to complete your personal care plan.  Depending on the condition, your plan could have included both over the counter or prescription medications.  If there is a problem please reply  once you have received a response from your provider.  Your safety is important to us .  If you have drug allergies check your prescription carefully.    You can use MyChart to ask questions about todays visit, request a non-urgent call back, or ask for a work or school excuse for 24 hours related to this e-Visit. If it has been greater than 24 hours you will need to follow up with your provider, or enter a new e-Visit to address those concerns.  You will get an e-mail in the next two days asking about your experience.  I hope that your e-visit has been valuable and will speed your recovery. Thank you for using e-visits.   I have spent 5 minutes in review of e-visit questionnaire, review and updating patient chart, medical decision making and response to patient.   Roosvelt Mater, PA-C

## 2024-02-22 MED ORDER — OSELTAMIVIR PHOSPHATE 75 MG PO CAPS: 75.0000 mg | ORAL_CAPSULE | Freq: Two times a day (BID) | ORAL | 0 refills | Status: AC

## 2024-02-22 NOTE — Addendum Note (Signed)
 Addended by: GLADIS ELSIE BROCKS on: 02/22/2024 08:02 AM   Modules accepted: Orders

## 2024-02-24 ENCOUNTER — Telehealth: Payer: Self-pay | Admitting: *Deleted

## 2024-02-24 ENCOUNTER — Ambulatory Visit: Admitting: Family Medicine

## 2024-02-24 NOTE — Telephone Encounter (Signed)
 Please send patient a MyChart message letting him know that it is recommended to be evaluated per standard of care

## 2024-02-24 NOTE — Telephone Encounter (Signed)
 Copied from CRM #[id number]. Topic: Clinical - Medication Question >> Feb 20, 2024  9:44 AM Myrick T wrote: Reason for CRM: patient called said he started having a headache, sore throat and fever. Patient does not want to come in but is asking for medication for his symptoms. He also said if he needs to be swabbed he would like to do it himself as his nose easily bleed.   ----------------------------------------------------------------------- From previous Reason for Contact - Scheduling: Patient/patient representative is calling to schedule an appointment. Refer to attachments for appointment information.

## 2024-03-05 ENCOUNTER — Other Ambulatory Visit: Payer: Self-pay

## 2024-03-05 MED ORDER — GENTAMICIN SULFATE 0.1 % EX OINT
TOPICAL_OINTMENT | Freq: Three times a day (TID) | CUTANEOUS | 3 refills | Status: AC
  Filled 2024-03-05: qty 30, 14d supply, fill #0

## 2024-03-05 MED ORDER — DOXYCYCLINE MONOHYDRATE 100 MG PO TABS
100.0000 mg | ORAL_TABLET | Freq: Two times a day (BID) | ORAL | 0 refills | Status: AC
  Filled 2024-03-05: qty 20, 10d supply, fill #0

## 2024-08-04 ENCOUNTER — Ambulatory Visit: Admitting: Family Medicine
